# Patient Record
Sex: Female | Born: 1990 | Race: Black or African American | Hispanic: No | Marital: Single | Smoking: Never smoker
Health system: Southern US, Community
[De-identification: ages and names within clinical notes are randomized; demographics above are authoritative.]

## PROBLEM LIST (undated history)

## (undated) DIAGNOSIS — R350 Frequency of micturition: Principal | ICD-10-CM

## (undated) DIAGNOSIS — R1032 Left lower quadrant pain: Secondary | ICD-10-CM

## (undated) DIAGNOSIS — A599 Trichomoniasis, unspecified: Secondary | ICD-10-CM

## (undated) DIAGNOSIS — R8789 Other abnormal findings in specimens from female genital organs: Secondary | ICD-10-CM

## (undated) DIAGNOSIS — N926 Irregular menstruation, unspecified: Secondary | ICD-10-CM

## (undated) DIAGNOSIS — M549 Dorsalgia, unspecified: Secondary | ICD-10-CM

## (undated) DIAGNOSIS — Z309 Encounter for contraceptive management, unspecified: Secondary | ICD-10-CM

## (undated) DIAGNOSIS — B9689 Other specified bacterial agents as the cause of diseases classified elsewhere: Secondary | ICD-10-CM

## (undated) DIAGNOSIS — B379 Candidiasis, unspecified: Secondary | ICD-10-CM

## (undated) DIAGNOSIS — N760 Acute vaginitis: Secondary | ICD-10-CM

## (undated) DIAGNOSIS — A749 Chlamydial infection, unspecified: Secondary | ICD-10-CM

## (undated) HISTORY — DX: Trichomoniasis, unspecified: A59.9

## (undated) HISTORY — DX: Frequency of micturition: R35.0

## (undated) HISTORY — DX: Irregular menstruation, unspecified: N92.6

## (undated) HISTORY — DX: Other abnormal findings in specimens from female genital organs: R87.89

## (undated) HISTORY — DX: Acute vaginitis: N76.0

## (undated) HISTORY — DX: Chlamydial infection, unspecified: A74.9

## (undated) HISTORY — DX: Encounter for contraceptive management, unspecified: Z30.9

## (undated) HISTORY — DX: Other specified bacterial agents as the cause of diseases classified elsewhere: B96.89

## (undated) HISTORY — DX: Left lower quadrant pain: R10.32

## (undated) HISTORY — DX: Candidiasis, unspecified: B37.9

---
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
## 2011-06-13 IMAGING — US US BREAST*L*
1 series · 4 of 4 positions shown · non-contrast
Comparison: None.

CLINICAL DATA: The patient feels a lump in the upper portion of
the left breast.

LEFT BREAST ULTRASOUND

[Series 1: us breast*left* · 0.08mm/px · 4 of 4 slices shown]
[im 1/4]
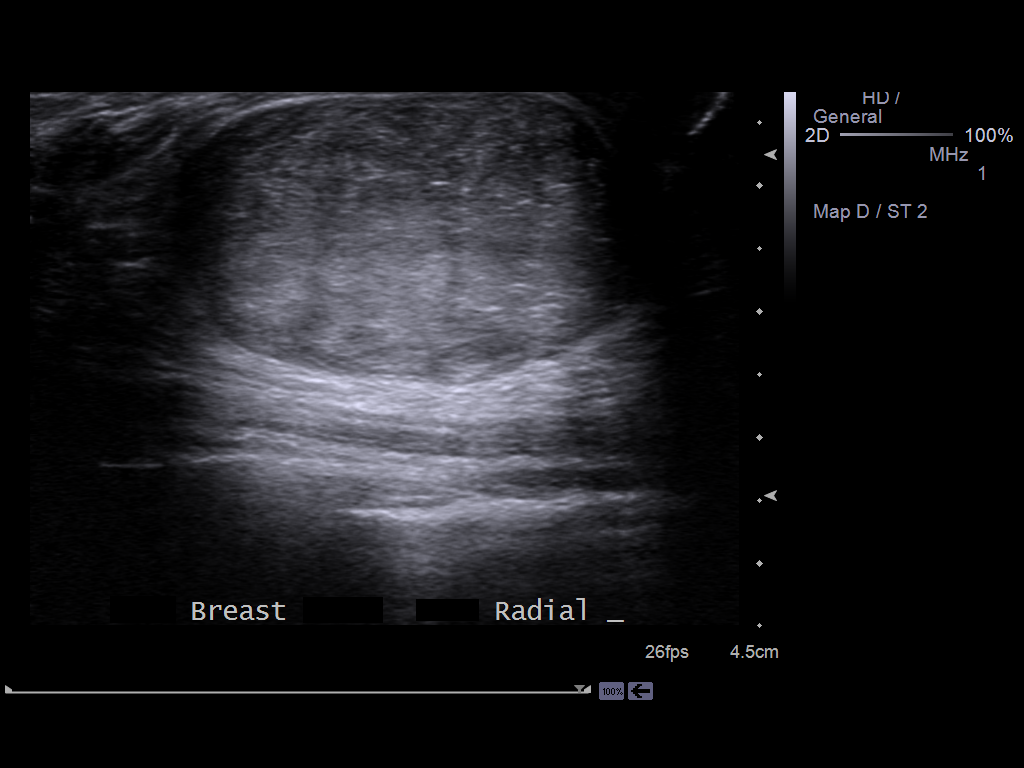
[im 2/4]
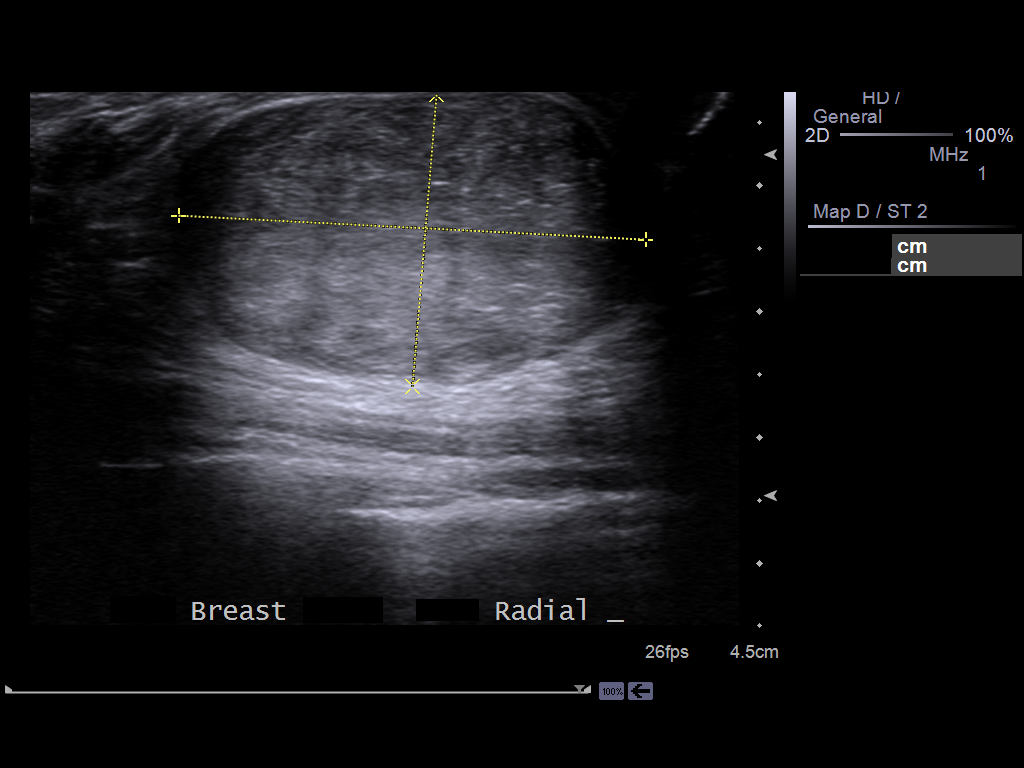
[im 3/4]
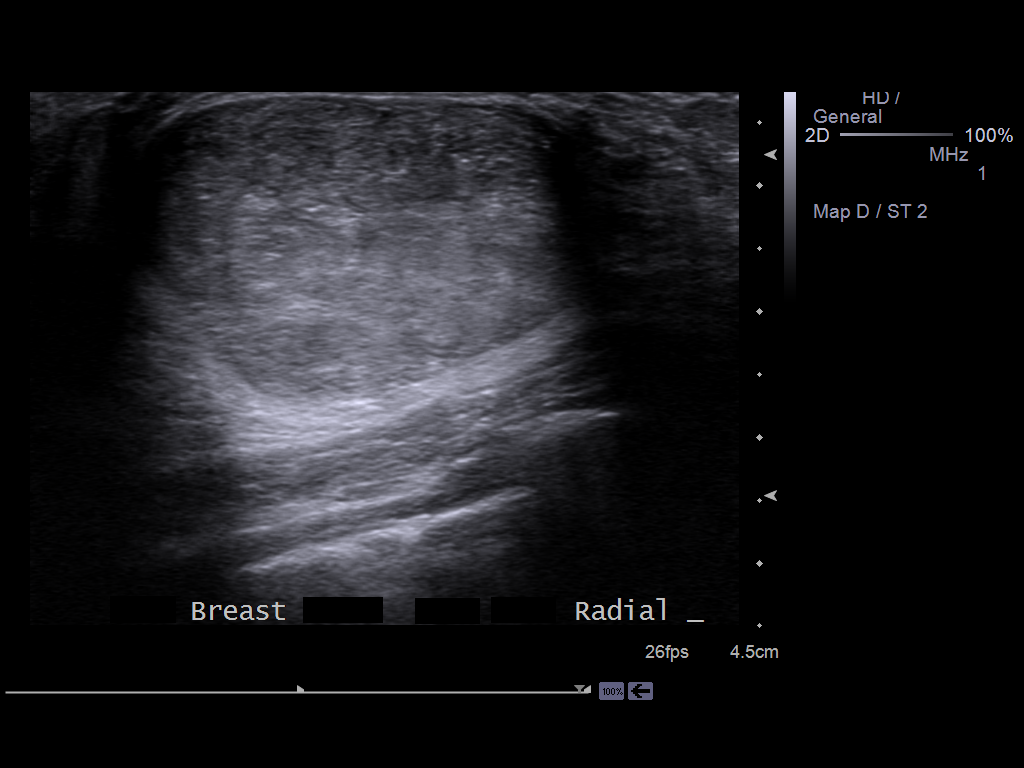
[im 4/4]
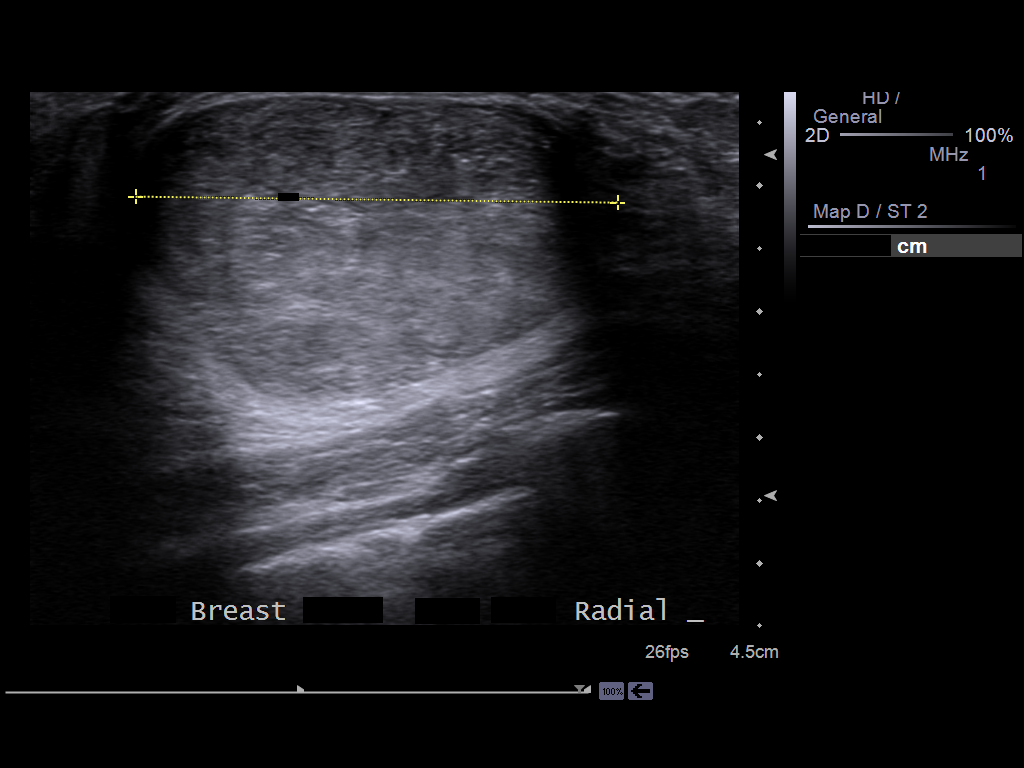

[4 of 4 positions shown; findings below may reference images not displayed]

On physical exam, I palpate a 3 cm oval mobile nodule at 12 o'clock
2 cm from the left nipple.
FINDINGS: Ultrasound is performed, showing an oval homogeneous
horizontally oriented well-defined solid mass in this location
measuring 3.7 x 2.3 x 3.8 cm.  This is very likely a benign
fibroadenoma.  I discussed management options with the patient
including short interval follow-up ultrasound (6, 12 and 24
months), ultrasound-guided core needle biopsy and surgical
excisional biopsy.  I recommended short interval follow-up
ultrasound given the high likelihood of benignancy.  The patient
states that she will consider all of these options before deciding.
IMPRESSION: Probably benign fibroadenoma in the left breast as described above.
Short interval follow-up ultrasound at 6, 12 and 24 months
suggested.  The patient will consider this as well as other options
of ultrasound-guided core needle biopsy or surgical excisional
biopsy.

BI-RADS CATEGORY 3:  Probably benign finding(s) - short interval
follow-up suggested.

## 2011-06-17 ENCOUNTER — Emergency Department (HOSPITAL_COMMUNITY)
Admission: EM | Admit: 2011-06-17 | Discharge: 2011-06-17 | Disposition: A | Discharge status: Home / Self Care | Payer: Medicaid Other | Attending: Emergency Medicine | Admitting: Emergency Medicine

## 2011-06-17 ENCOUNTER — Encounter (HOSPITAL_COMMUNITY): Payer: Self-pay | Admitting: Emergency Medicine

## 2011-06-17 DIAGNOSIS — H9209 Otalgia, unspecified ear: Secondary | ICD-10-CM | POA: Insufficient documentation

## 2011-06-17 DIAGNOSIS — H9202 Otalgia, left ear: Secondary | ICD-10-CM

## 2011-06-17 MED ORDER — OXYCODONE-ACETAMINOPHEN 5-325 MG PO TABS
1.0000 | ORAL_TABLET | Freq: Once | ORAL | Status: AC
  Administered 2011-06-17: 1 via ORAL
  Filled 2011-06-17: qty 1

## 2011-06-17 NOTE — ED Provider Notes (Signed)
History     CSN: [id number]  Arrival date & time 06/17/11  4098   First MD Initiated Contact with Patient 06/17/11 0407      Chief Complaint  Patient presents with  . Otalgia    (Consider location/radiation/quality/duration/timing/severity/associated sxs/prior treatment) Patient is a 21 y.o. female presenting with ear pain. The history is provided by the patient.  Otalgia  She was taking bone out of her hair and accidentally poked her left ear with a bobby pin. She is complaining of pain in that ear which is moderate to severe and she rates it 8/10. Nothing makes it better nothing makes it worse. She thinks her hearing is somewhat diminished in that ear but she can't hear out of it. She denies other injury.  History reviewed. No pertinent past medical history.  History reviewed. No pertinent past surgical history.  History reviewed. No pertinent family history.  History  Substance Use Topics  . Smoking status: Never Smoker   . Smokeless tobacco: Not on file  . Alcohol Use: No    OB History    Grav Para Term Preterm Abortions TAB SAB Ect Mult Living                  Review of Systems  HENT: Positive for ear pain.   All other systems reviewed and are negative.    Allergies  Review of patient's allergies indicates not on file.  Home Medications  No current outpatient prescriptions on file.  BP 112/64  Pulse 74  Temp(Src) 98.3 F (36.8 C) (Oral)  Resp 20  Ht 5\' 5"  (1.651 m)  Wt 138 lb (62.596 kg)  BMI 22.96 kg/m2  SpO2 100%  Physical Exam  Nursing note and vitals reviewed.  21 year old female who is resting comfortably and in no acute distress. Vital signs are normal. Oxygen saturation is 100% which is normal. Head is normocephalic and atraumatic. PERRLA, EOMI. Tympanic membranes show mild erythema on the left but no evidence of puncture. Light reflexes normal. There is no evidence of any trauma to the external auditory canal. Oropharynx is clear. Neck is  nontender and soft supple without adenopathy. Back is nontender. Lungs are clear without rales, wheezes, or rhonchi. Heart has regular rate and rhythm without murmur. Abdomen is soft, flat, nontender. Extremities have full range of motion without pain. Skin is warm and dry without rash. Neurologic exam is normal.  ED Course  Procedures (including critical care time)  Labs Reviewed - No data to display No results found.   No diagnosis found.    MDM  Left ear pain with out evidence of any injury to the ear. She will need to be treated symptomatically. No indication for antibiotic drops.        Dione Booze, MD 06/17/11 [phone]

## 2011-06-17 NOTE — ED Notes (Signed)
Patient states she woke up and one of her bobby pins had fallen from her hair. When she went to fix it, she accidentally pushed it into her left ear. Complaining of left ear pain. Denies bleeding or drainage from ear.

## 2011-06-17 NOTE — Discharge Instructions (Signed)
Take Tylenol or ibuprofen as needed for pain. Return to the emergency department his symptoms are getting worse.

## 2011-10-05 ENCOUNTER — Emergency Department (HOSPITAL_COMMUNITY)
Admission: EM | Admit: 2011-10-05 | Discharge: 2011-10-05 | Disposition: A | Discharge status: Home / Self Care | Payer: Medicaid Other | Attending: Emergency Medicine | Admitting: Emergency Medicine

## 2011-10-05 ENCOUNTER — Encounter (HOSPITAL_COMMUNITY): Payer: Self-pay | Admitting: *Deleted

## 2011-10-05 DIAGNOSIS — S61209A Unspecified open wound of unspecified finger without damage to nail, initial encounter: Secondary | ICD-10-CM | POA: Insufficient documentation

## 2011-10-05 DIAGNOSIS — Y998 Other external cause status: Secondary | ICD-10-CM | POA: Insufficient documentation

## 2011-10-05 DIAGNOSIS — Y9389 Activity, other specified: Secondary | ICD-10-CM | POA: Insufficient documentation

## 2011-10-05 DIAGNOSIS — S61411A Laceration without foreign body of right hand, initial encounter: Secondary | ICD-10-CM

## 2011-10-05 DIAGNOSIS — W268XXA Contact with other sharp object(s), not elsewhere classified, initial encounter: Secondary | ICD-10-CM | POA: Insufficient documentation

## 2011-10-05 MED ORDER — BACITRACIN ZINC 500 UNIT/GM EX OINT
TOPICAL_OINTMENT | CUTANEOUS | Status: AC
  Filled 2011-10-05: qty 1.8

## 2011-10-05 MED ORDER — TETANUS-DIPHTH-ACELL PERTUSSIS 5-2.5-18.5 LF-MCG/0.5 IM SUSP: 0.5000 mL | Freq: Once | INTRAMUSCULAR | Status: DC

## 2011-10-05 NOTE — ED Provider Notes (Signed)
Medical screening examination/treatment/procedure(s) were performed by non-physician practitioner and as supervising physician I was immediately available for consultation/collaboration.  Flint Melter, MD 10/05/11 [phone]

## 2011-10-05 NOTE — ED Notes (Signed)
Mult finger lac, broke glass in door when trying to get into her house, Was locked out

## 2011-10-05 NOTE — ED Provider Notes (Signed)
History     CSN: [id number]  Arrival date & time 10/05/11  2150   First MD Initiated Contact with Patient 10/05/11 2200      Chief Complaint  Patient presents with  . Laceration    (Consider location/radiation/quality/duration/timing/severity/associated sxs/prior treatment) HPI Comments: Patient complains of several small lacerations to her right hand that occurred when she was trying to open a window and the glass broke. Incident occurred approximately 2 hours ago. She complains of bleeding to her right thumb. She denies numbness or weakness of her hand or fingers. She states she received a tetanus vaccine when she gave childbirth one year ago.   Patient is a 21 y.o. female presenting with skin laceration. The history is provided by the patient.  Laceration  The incident occurred 1 to 2 hours ago. The laceration is located on the right hand. The laceration is 1 cm in size. The laceration mechanism was a broken glass. The pain is mild. The pain has been constant since onset. She reports no foreign bodies present. Her tetanus status is unknown.    History reviewed. No pertinent past medical history.  History reviewed. No pertinent past surgical history.  History reviewed. No pertinent family history.  History  Substance Use Topics  . Smoking status: Never Smoker   . Smokeless tobacco: Not on file  . Alcohol Use: No    OB History    Grav Para Term Preterm Abortions TAB SAB Ect Mult Living                  Review of Systems  Constitutional: Negative for fever and chills.  Genitourinary: Negative for dysuria and difficulty urinating.  Skin: Positive for wound. Negative for color change.       Lacerations to the fingers of the right hand  Neurological: Negative for weakness and numbness.  Hematological: Does not bruise/bleed easily.  All other systems reviewed and are negative.    Allergies  Bee venom  Home Medications   Current Outpatient Rx  Name Route Sig  Dispense Refill  . MEDROXYPROGESTERONE ACETATE 150 MG/ML IM SUSP Intramuscular Inject 150 mg into the muscle every 3 (three) months.      BP 109/62  Pulse 101  Temp 98.8 F (37.1 C) (Oral)  Resp 20  Ht 5\' 6"  (1.676 m)  Wt 134 lb (60.782 kg)  BMI 21.63 kg/m2  SpO2 100%  Physical Exam  Nursing note and vitals reviewed. Constitutional: She is oriented to person, place, and time. She appears well-developed and well-nourished. No distress.  HENT:  Head: Normocephalic and atraumatic.  Cardiovascular: Normal rate, regular rhythm and normal heart sounds.   Pulmonary/Chest: Effort normal and breath sounds normal.  Musculoskeletal: She exhibits tenderness. She exhibits no edema.       Right hand: She exhibits laceration. She exhibits normal range of motion, no tenderness, no bony tenderness, normal two-point discrimination, normal capillary refill, no deformity and no swelling. normal sensation noted. Normal strength noted.       Hands: Neurological: She is alert and oriented to person, place, and time. She exhibits normal muscle tone. Coordination normal.  Skin:       See MS exam    ED Course  Procedures (including critical care time)  Labs Reviewed - No data to display      MDM    Skin avulsion of the distal end of the right thumb. Bleeding is controlled with pressure. Sensation is intact. Patient has full range of motion of all  her fingers or several small superficial cuts to the third fourth and fifth fingers of the same hand. Radial pulse is brisk cap refill is less than 2 seconds   Wounds were cleaned with wound cleanser and Xeroform dressing was applied to the thumb, pain improved, Td was UTD. Patient advised to return to ER for any worsening symptoms.  Wound(s) explored with adequate hemostasis through ROM, no apparent gross foreign body retained, no significant involvement of deep structures such as bone / joint / tendon / or neurovascular involvement noted.  Baseline  Strength and Sensation to affected extremity(ies) with normal light touch for Pt, distal NVI with CR< 2 secs and pulse(s) intact to affected extremity(ies).   Xia Stohr L. Shalamar Crays, PA 10/05/11 2226  Duilio Heritage L. [place], [place] 10/05/11 2228

## 2011-10-05 NOTE — ED Notes (Signed)
Patient states she had a tetanus at [facility] tree during prenatal care, just to be sure she will check on Monday, right thumb dressed with xeroform, bacitracin applied to minor cuts on fingers and band aids applied

## 2011-10-14 ENCOUNTER — Emergency Department (HOSPITAL_COMMUNITY)
Admission: EM | Admit: 2011-10-14 | Discharge: 2011-10-14 | Disposition: A | Discharge status: Home / Self Care | Payer: Medicaid Other | Attending: Emergency Medicine | Admitting: Emergency Medicine

## 2011-10-14 ENCOUNTER — Encounter (HOSPITAL_COMMUNITY): Payer: Self-pay | Admitting: *Deleted

## 2011-10-14 DIAGNOSIS — K529 Noninfective gastroenteritis and colitis, unspecified: Secondary | ICD-10-CM

## 2011-10-14 DIAGNOSIS — R112 Nausea with vomiting, unspecified: Secondary | ICD-10-CM | POA: Insufficient documentation

## 2011-10-14 DIAGNOSIS — R197 Diarrhea, unspecified: Secondary | ICD-10-CM | POA: Insufficient documentation

## 2011-10-14 LAB — URINALYSIS, ROUTINE W REFLEX MICROSCOPIC
Hgb urine dipstick: NEGATIVE
Protein, ur: NEGATIVE mg/dL
Urobilinogen, UA: 0.2 mg/dL (ref 0.0–1.0)

## 2011-10-14 LAB — CBC WITH DIFFERENTIAL/PLATELET
Basophils Absolute: 0 10*3/uL (ref 0.0–0.1)
Eosinophils Absolute: 0.5 10*3/uL (ref 0.0–0.7)
Eosinophils Relative: 7 % — ABNORMAL HIGH (ref 0–5)
MCH: 30.6 pg (ref 26.0–34.0)
MCHC: 34.7 g/dL (ref 30.0–36.0)
MCV: 88.2 fL (ref 78.0–100.0)
Platelets: 201 10*3/uL (ref 150–400)
RDW: 12.1 % (ref 11.5–15.5)

## 2011-10-14 LAB — PREGNANCY, URINE: Preg Test, Ur: NEGATIVE

## 2011-10-14 LAB — BASIC METABOLIC PANEL
Calcium: 9.8 mg/dL (ref 8.4–10.5)
GFR calc non Af Amer: >90 mL/min (ref >90)
Glucose, Bld: 102 mg/dL — ABNORMAL HIGH (ref 70–99)
Sodium: 136 mEq/L (ref 135–145)

## 2011-10-14 MED ORDER — ONDANSETRON HCL 4 MG/2ML IJ SOLN: 4.0000 mg | Freq: Once | INTRAMUSCULAR | Status: DC

## 2011-10-14 MED ORDER — SODIUM CHLORIDE 0.9 % IV BOLUS (SEPSIS)
1000.0000 mL | Freq: Once | INTRAVENOUS | Status: AC
  Administered 2011-10-14: 1000 mL via INTRAVENOUS

## 2011-10-14 MED ORDER — PROMETHAZINE HCL 25 MG PO TABS: 25.0000 mg | ORAL_TABLET | Freq: Four times a day (QID) | ORAL | Status: DC | PRN

## 2011-10-14 MED ORDER — MORPHINE SULFATE 4 MG/ML IJ SOLN: 4.0000 mg | Freq: Once | INTRAMUSCULAR | Status: DC

## 2011-10-14 NOTE — ED Notes (Addendum)
Pt c/o diarrhea that started last night, n/v that started this am, sharp stabbing pain to bilateral eyes worse with n/v, c/o headache. Pt reports that her brother was sick with diarrhea yesterday as well.

## 2011-10-14 NOTE — ED Provider Notes (Signed)
History     CSN: [id number]  Arrival date & time 10/14/11  1740   First MD Initiated Contact with Patient 10/14/11 1747      Chief Complaint  Patient presents with  . Nausea  . Emesis  . Diarrhea    (Consider location/radiation/quality/duration/timing/severity/associated sxs/prior treatment) HPI Pt reports she began having watery diarrhea last night associated with nausea and vomiting. No fever, no blood. Some intermittent cramping abdominal pain. Symptoms persisted today and she woke up with a moderate aching headache behind eyes.   History reviewed. No pertinent past medical history.  History reviewed. No pertinent past surgical history.  No family history on file.  History  Substance Use Topics  . Smoking status: Never Smoker   . Smokeless tobacco: Not on file  . Alcohol Use: No    OB History    Grav Para Term Preterm Abortions TAB SAB Ect Mult Living                  Review of Systems All other systems reviewed and are negative except as noted in HPI.   Allergies  Bee venom  Home Medications   Current Outpatient Rx  Name Route Sig Dispense Refill  . MEDROXYPROGESTERONE ACETATE 150 MG/ML IM SUSP Intramuscular Inject 150 mg into the muscle every 3 (three) months.      BP 107/63  Pulse 86  Temp 98.1 F (36.7 C) (Oral)  Resp 16  Ht 5\' 5"  (1.651 m)  Wt 138 lb (62.596 kg)  BMI 22.96 kg/m2  SpO2 100%  Physical Exam  Nursing note and vitals reviewed. Constitutional: She is oriented to person, place, and time. She appears well-developed and well-nourished.  HENT:  Head: Normocephalic and atraumatic.  Eyes: EOM are normal. Pupils are equal, round, and reactive to light.  Neck: Normal range of motion. Neck supple.  Cardiovascular: Normal rate, normal heart sounds and intact distal pulses.   Pulmonary/Chest: Effort normal and breath sounds normal.  Abdominal: Bowel sounds are normal. She exhibits no distension. There is no tenderness.  Musculoskeletal:  Normal range of motion. She exhibits no edema and no tenderness.  Neurological: She is alert and oriented to person, place, and time. She has normal strength. No cranial nerve deficit or sensory deficit.  Skin: Skin is warm and dry. No rash noted.  Psychiatric: She has a normal mood and affect.    ED Course  Procedures (including critical care time)  Labs Reviewed  CBC WITH DIFFERENTIAL - Abnormal; Notable for the following:    Neutrophils Relative 36 (*)     Lymphocytes Relative 52 (*)     Eosinophils Relative 7 (*)     All other components within normal limits  BASIC METABOLIC PANEL - Abnormal; Notable for the following:    Glucose, Bld 102 (*)     All other components within normal limits  URINALYSIS, ROUTINE W REFLEX MICROSCOPIC  PREGNANCY, URINE   No results found.   No diagnosis found.    MDM  Labs unremarkable. Pt feeling better. Will attempt PO trial. Waiting for IVF bolus to finish.   Pt tolerating PO trial. Ready for discharge.      Charles B. Bernette Mayers, MD 10/14/11 1919

## 2011-10-14 NOTE — ED Notes (Signed)
Patient does not wish to take anything for pain or nausea at this time.

## 2011-10-21 ENCOUNTER — Encounter (HOSPITAL_COMMUNITY): Payer: Self-pay | Admitting: Emergency Medicine

## 2011-10-21 ENCOUNTER — Emergency Department (HOSPITAL_COMMUNITY)
Admission: EM | Admit: 2011-10-21 | Discharge: 2011-10-21 | Disposition: A | Discharge status: Home / Self Care | Payer: Medicaid Other | Attending: Emergency Medicine | Admitting: Emergency Medicine

## 2011-10-21 DIAGNOSIS — X58XXXA Exposure to other specified factors, initial encounter: Secondary | ICD-10-CM | POA: Insufficient documentation

## 2011-10-21 DIAGNOSIS — S335XXA Sprain of ligaments of lumbar spine, initial encounter: Secondary | ICD-10-CM | POA: Insufficient documentation

## 2011-10-21 DIAGNOSIS — S39012A Strain of muscle, fascia and tendon of lower back, initial encounter: Secondary | ICD-10-CM

## 2011-10-21 HISTORY — DX: Dorsalgia, unspecified: M54.9

## 2011-10-21 MED ORDER — CYCLOBENZAPRINE HCL 10 MG PO TABS: 10.0000 mg | ORAL_TABLET | Freq: Three times a day (TID) | ORAL | Status: AC | PRN

## 2011-10-21 MED ORDER — NAPROXEN 500 MG PO TABS: 500.0000 mg | ORAL_TABLET | Freq: Two times a day (BID) | ORAL | Status: DC

## 2011-10-21 NOTE — ED Provider Notes (Signed)
Medical screening examination/treatment/procedure(s) were performed by non-physician practitioner and as supervising physician I was immediately available for consultation/collaboration.   Jairen Goldfarb M Shemeca Lukasik, MD 10/21/11 2326 

## 2011-10-21 NOTE — ED Notes (Signed)
Lower back pain that has been going on for a while, states that her job made her come and be checked out.

## 2011-10-21 NOTE — ED Provider Notes (Signed)
History     CSN: [id number]  Arrival date & time 10/21/11  2209   First MD Initiated Contact with Patient 10/21/11 2222      Chief Complaint  Patient presents with  . Back Pain    (Consider location/radiation/quality/duration/timing/severity/associated sxs/prior treatment) HPI Comments: Patient c/o low back pain that began this evening while at work.  States she recently started a new job at [employer] that requires her to do a lot of bending and heavy lifting.  States that her back has been hurting more since starting this job.  She states that she injured her lower back "years ago" and has intermittent back pain since that time and her pain this evening is similar to previous.  She denies fever, urinary symptoms, incontinence, abdominal pain, numbness or weakness of her legs.  Patient is a 21 y.o. female presenting with back pain. The history is provided by the patient.  Back Pain  This is a recurrent problem. The current episode started 1 to 2 hours ago. The problem occurs constantly. The problem has not changed since onset.The pain is associated with lifting heavy objects and twisting (bending). The pain is present in the lumbar spine. The quality of the pain is described as aching. The pain does not radiate. The pain is mild. The symptoms are aggravated by bending, twisting and certain positions. Pertinent negatives include no fever, no numbness, no abdominal pain, no abdominal swelling, no bowel incontinence, no perianal numbness, no bladder incontinence, no dysuria, no pelvic pain, no leg pain, no paresthesias, no paresis, no tingling and no weakness. She has tried nothing for the symptoms.    Past Medical History  Diagnosis Date  . Back pain     History reviewed. No pertinent past surgical history.  History reviewed. No pertinent family history.  History  Substance Use Topics  . Smoking status: Never Smoker   . Smokeless tobacco: Not on file  . Alcohol Use: No    OB  History    Grav Para Term Preterm Abortions TAB SAB Ect Mult Living                  Review of Systems  Constitutional: Negative for fever.  Respiratory: Negative for shortness of breath.   Gastrointestinal: Negative for vomiting, abdominal pain, constipation and bowel incontinence.  Genitourinary: Negative for bladder incontinence, dysuria, hematuria, flank pain, decreased urine volume, vaginal bleeding, vaginal discharge, difficulty urinating and pelvic pain.       Perineal numbness or incontinence of urine or feces  Musculoskeletal: Positive for back pain. Negative for joint swelling.  Skin: Negative for rash.  Neurological: Negative for tingling, weakness, numbness and paresthesias.  All other systems reviewed and are negative.    Allergies  Bee venom  Home Medications   Current Outpatient Rx  Name Route Sig Dispense Refill  . CYCLOBENZAPRINE HCL 10 MG PO TABS Oral Take 1 tablet (10 mg total) by mouth 3 (three) times daily as needed for muscle spasms. 21 tablet 0  . MEDROXYPROGESTERONE ACETATE 150 MG/ML IM SUSP Intramuscular Inject 150 mg into the muscle every 3 (three) months.    Marland Kitchen NAPROXEN 500 MG PO TABS Oral Take 1 tablet (500 mg total) by mouth 2 (two) times daily. 30 tablet 0  . PROMETHAZINE HCL 25 MG PO TABS Oral Take 1 tablet (25 mg total) by mouth every 6 (six) hours as needed for nausea. 20 tablet 0    BP 117/70  Pulse 101  Temp 99.2 F (37.3  C) (Oral)  Resp 14  Ht 5\' 5"  (1.651 m)  Wt 138 lb (62.596 kg)  BMI 22.96 kg/m2  SpO2 100%  Physical Exam  Nursing note and vitals reviewed. Constitutional: She is oriented to person, place, and time. She appears well-developed and well-nourished. No distress.  HENT:  Head: Normocephalic and atraumatic.  Neck: Normal range of motion. Neck supple.  Cardiovascular: Normal rate, regular rhythm and intact distal pulses.   No murmur heard. Pulmonary/Chest: Effort normal and breath sounds normal.  Abdominal: Soft. She  exhibits no distension. There is no tenderness.  Musculoskeletal: She exhibits tenderness. She exhibits no edema.       Lumbar back: She exhibits tenderness and pain. She exhibits normal range of motion, no swelling, no deformity, no laceration and normal pulse.       Back:  Neurological: She is alert and oriented to person, place, and time. No cranial nerve deficit or sensory deficit. She exhibits normal muscle tone. Coordination and gait normal.  Reflex Scores:      Patellar reflexes are 2+ on the right side and 2+ on the left side.      Achilles reflexes are 2+ on the right side and 2+ on the left side. Skin: Skin is warm and dry.    ED Course  Procedures (including critical care time)  Labs Reviewed - No data to display   1. Lumbar strain       MDM    Previous ED charts reviewed by me.    Patient has ttp of the lumbar paraspinal muscles.  No focal neuro deficits on exam.  Ambulates with a steady gait.   Requests referral for PMD.  Likely muscular strain.  Doubt infectious or neurological process.    The patient appears reasonably screened and/or stabilized for discharge and I doubt any other medical condition or other [facility] requiring further screening, evaluation, or treatment in the ED at this time prior to discharge.    Prescribed:  Naprosyn flexeril   Yumna Ebers L. [place], [place] 10/21/11 2312

## 2011-10-21 NOTE — ED Notes (Signed)
Patient states she was at work and started having lower back pain after bending over a lot. States "I hurt my back when I was younger and I have pain off and on over the years."

## 2011-11-09 ENCOUNTER — Encounter (HOSPITAL_COMMUNITY): Payer: Self-pay | Admitting: Vascular Surgery

## 2011-11-09 ENCOUNTER — Emergency Department (HOSPITAL_COMMUNITY)
Admission: EM | Admit: 2011-11-09 | Discharge: 2011-11-09 | Disposition: A | Discharge status: Home / Self Care | Payer: Medicaid Other | Attending: Emergency Medicine | Admitting: Emergency Medicine

## 2011-11-09 DIAGNOSIS — I10 Essential (primary) hypertension: Secondary | ICD-10-CM | POA: Insufficient documentation

## 2011-11-09 DIAGNOSIS — M545 Low back pain, unspecified: Secondary | ICD-10-CM | POA: Insufficient documentation

## 2011-11-09 DIAGNOSIS — Z91038 Other insect allergy status: Secondary | ICD-10-CM | POA: Insufficient documentation

## 2011-11-09 MED ORDER — IBUPROFEN 800 MG PO TABS
800.0000 mg | ORAL_TABLET | Freq: Once | ORAL | Status: AC
  Administered 2011-11-09: 800 mg via ORAL
  Filled 2011-11-09: qty 1
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
## 2011-11-18 ENCOUNTER — Encounter (HOSPITAL_COMMUNITY): Payer: Self-pay | Admitting: Emergency Medicine

## 2011-11-18 ENCOUNTER — Emergency Department (HOSPITAL_COMMUNITY)
Admission: EM | Admit: 2011-11-18 | Discharge: 2011-11-18 | Disposition: A | Discharge status: Home / Self Care | Payer: Medicaid Other | Attending: Emergency Medicine | Admitting: Emergency Medicine

## 2011-11-18 DIAGNOSIS — Z91038 Other insect allergy status: Secondary | ICD-10-CM | POA: Insufficient documentation

## 2011-11-18 DIAGNOSIS — L42 Pityriasis rosea: Secondary | ICD-10-CM

## 2011-11-18 MED ORDER — PREDNISONE 10 MG PO TABS: ORAL_TABLET | ORAL | Status: DC

## 2011-11-18 MED ORDER — TRIAMCINOLONE ACETONIDE 0.1 % EX CREA: TOPICAL_CREAM | Freq: Two times a day (BID) | CUTANEOUS | Status: DC

## 2011-11-18 NOTE — ED Notes (Signed)
Pt c/o ringworm rash x 1 week

## 2011-11-18 NOTE — ED Provider Notes (Signed)
History     CSN: [id number]  Arrival date & time 11/18/11  1031   None     Chief Complaint  Patient presents with  . Recurrent Skin Infections    (Consider location/radiation/quality/duration/timing/severity/associated sxs/prior treatment) HPI Comments: Patient reports at least a one-week history of a rash that she thinks may be ringworm. She states that she noted a patch that was about the size of a quarter on the side of the left chest. She then noticed rash on the back, lower abdomen, and arms. She complains of itching. Is no drainage appreciated. The patient states that she has not been bothered by nausea, vomiting, diarrhea. She has had some mild temperature elevations from time to time. She presents now for assistance with this particular problem.  The history is provided by the patient.    Past Medical History  Diagnosis Date  . Back pain     History reviewed. No pertinent past surgical history.  Family History  Problem Relation Age of Onset  . Hypertension Mother     History  Substance Use Topics  . Smoking status: Never Smoker   . Smokeless tobacco: Not on file  . Alcohol Use: No    OB History    Grav Para Term Preterm Abortions TAB SAB Ect Mult Living   1 1  1      1       Review of Systems  Constitutional: Negative for activity change.       All ROS Neg except as noted in HPI  HENT: Negative for nosebleeds and neck pain.   Eyes: Negative for photophobia and discharge.  Respiratory: Negative for cough, shortness of breath and wheezing.   Cardiovascular: Negative for chest pain and palpitations.  Gastrointestinal: Negative for abdominal pain and blood in stool.  Genitourinary: Negative for dysuria, frequency and hematuria.  Musculoskeletal: Positive for back pain. Negative for arthralgias.  Skin: Positive for rash.  Neurological: Negative for dizziness, seizures and speech difficulty.  Psychiatric/Behavioral: Negative for hallucinations and confusion.      Allergies  Bee venom  Home Medications   Current Outpatient Rx  Name Route Sig Dispense Refill  . MEDROXYPROGESTERONE ACETATE 150 MG/ML IM SUSP Intramuscular Inject 150 mg into the muscle every 3 (three) months.    Marland Kitchen NAPROXEN 500 MG PO TABS Oral Take 1 tablet (500 mg total) by mouth 2 (two) times daily. 30 tablet 0  . PROMETHAZINE HCL 25 MG PO TABS Oral Take 1 tablet (25 mg total) by mouth every 6 (six) hours as needed for nausea. 20 tablet 0    BP 120/67  Pulse 83  Temp 99.9 F (37.7 C)  Resp 16  Ht 5\' 5"  (1.651 m)  Wt 129 lb (58.514 kg)  BMI 21.47 kg/m2  SpO2 100%  Physical Exam  Nursing note and vitals reviewed. Constitutional: She is oriented to person, place, and time. She appears well-developed and well-nourished.  Non-toxic appearance.  HENT:  Head: Normocephalic.  Right Ear: Tympanic membrane and external ear normal.  Left Ear: Tympanic membrane and external ear normal.  Eyes: EOM and lids are normal. Pupils are equal, round, and reactive to light.  Neck: Normal range of motion. Neck supple. Carotid bruit is not present.  Cardiovascular: Normal rate, regular rhythm, normal heart sounds, intact distal pulses and normal pulses.   Pulmonary/Chest: Breath sounds normal. No respiratory distress.  Abdominal: Soft. Bowel sounds are normal. There is no tenderness. There is no guarding.  Musculoskeletal: Normal range of motion.  Lymphadenopathy:       Head (right side): No submandibular adenopathy present.       Head (left side): No submandibular adenopathy present.    She has no cervical adenopathy.  Neurological: She is alert and oriented to person, place, and time. She has normal strength. No cranial nerve deficit or sensory deficit.  Skin: Skin is warm and dry.       There is a 2 cm pink tube age skin lesion of the left upper chest just behind the tail of the breast. There is a rib slightly raised border present. There are oval lesions in patches on the upper back  mid back right upper arm left lower arm and lower abdomen.  Psychiatric: She has a normal mood and affect. Her speech is normal.    ED Course  Procedures (including critical care time)  Labs Reviewed - No data to display No results found.   No diagnosis found.    MDM  I have reviewed nursing notes, vital signs, and all appropriate lab and imaging results for this patient. Suspect patient has pityriasis rosea. Patient is treated with prednisone taper and triamcinolone drained to assist with the itching. Patient has been educated on this particular rash and understands it may take from 2-8 weeks to clear. Patient has been given the name of the dermatologist for additional evaluation if not improving.       Kathie Dike, [place] 11/18/11 1247

## 2011-11-19 NOTE — ED Provider Notes (Signed)
Medical screening examination/treatment/procedure(s) were performed by non-physician practitioner and as supervising physician I was immediately available for consultation/collaboration.  Shelda Jakes, MD 11/19/11 [phone]

## 2011-12-22 ENCOUNTER — Encounter (HOSPITAL_COMMUNITY): Payer: Self-pay | Admitting: *Deleted

## 2011-12-22 ENCOUNTER — Emergency Department (HOSPITAL_COMMUNITY)
Admission: EM | Admit: 2011-12-22 | Discharge: 2011-12-22 | Disposition: A | Discharge status: Home / Self Care | Payer: Medicaid Other | Attending: Emergency Medicine | Admitting: Emergency Medicine

## 2011-12-22 DIAGNOSIS — R21 Rash and other nonspecific skin eruption: Secondary | ICD-10-CM | POA: Insufficient documentation

## 2011-12-22 DIAGNOSIS — Z91038 Other insect allergy status: Secondary | ICD-10-CM | POA: Insufficient documentation

## 2011-12-22 DIAGNOSIS — Z8249 Family history of ischemic heart disease and other diseases of the circulatory system: Secondary | ICD-10-CM | POA: Insufficient documentation

## 2011-12-22 MED ORDER — PREDNISOLONE SODIUM PHOSPHATE 15 MG/5ML PO SOLN: 60.0000 mg | Freq: Every day | ORAL | Status: AC

## 2011-12-22 MED ORDER — FAMOTIDINE 20 MG PO TABS
20.0000 mg | ORAL_TABLET | Freq: Once | ORAL | Status: AC
  Administered 2011-12-22: 20 mg via ORAL
  Filled 2011-12-22: qty 1

## 2011-12-22 MED ORDER — DIPHENHYDRAMINE HCL 25 MG PO CAPS
50.0000 mg | ORAL_CAPSULE | Freq: Once | ORAL | Status: AC
  Administered 2011-12-22: 50 mg via ORAL
  Filled 2011-12-22: qty 2

## 2011-12-22 MED ORDER — PREDNISOLONE SODIUM PHOSPHATE 15 MG/5ML PO SOLN
60.0000 mg | Freq: Once | ORAL | Status: AC
  Administered 2011-12-22: 60 mg via ORAL
  Filled 2011-12-22: qty 20

## 2011-12-22 NOTE — ED Notes (Signed)
Pt presents with a small  Raised red rash on rt side neck and upper chest. Pt states was here on 1 st of October and treated for same type rash. Pt states the prednisone was "too strong for her and would throw them up". Pt also has a scaly rash on left side, and top of buttocks. NAD noted. Denies SOB. Pt is alert and oriented x 4.

## 2011-12-22 NOTE — ED Provider Notes (Signed)
History     CSN: [id number]  Arrival date & time 12/22/11  4098   First MD Initiated Contact with Patient 12/22/11 1938      Chief Complaint  Patient presents with  . Rash    (Consider location/radiation/quality/duration/timing/severity/associated sxs/prior treatment) HPI Comments: Pt seen here on 12-18-11 and dx with pityriasis and prescribed prednisone taper.  States she doesn't take pills well any way but the medicine tasted bad and made her throw up.  No new sxs.  No fever or chills.  Patient is a 21 y.o. female presenting with rash. The history is provided by the patient. No language interpreter was used.  Rash  This is a new problem. Episode onset: ~ 1 week ago. The problem has not changed since onset.The problem is associated with nothing. There has been no fever. The patient is experiencing no pain. The pain has been constant since onset. Associated symptoms include itching.    Past Medical History  Diagnosis Date  . Back pain     History reviewed. No pertinent past surgical history.  Family History  Problem Relation Age of Onset  . Hypertension Mother     History  Substance Use Topics  . Smoking status: Never Smoker   . Smokeless tobacco: Not on file  . Alcohol Use: No    OB History    Grav Para Term Preterm Abortions TAB SAB Ect Mult Living   1 1  1      1       Review of Systems  Constitutional: Negative for fever and chills.  Musculoskeletal: Negative for myalgias.  Skin: Positive for itching and rash.  All other systems reviewed and are negative.    Allergies  Bee venom  Home Medications   Current Outpatient Rx  Name Route Sig Dispense Refill  . MEDROXYPROGESTERONE ACETATE 150 MG/ML IM SUSP Intramuscular Inject 150 mg into the muscle every 3 (three) months.    Marland Kitchen PREDNISOLONE SODIUM PHOSPHATE 15 MG/5ML PO SOLN Oral Take 20 mLs (60 mg total) by mouth daily. 100 mL 0    BP 127/79  Pulse 75  Temp 99.2 F (37.3 C) (Oral)  Resp 20  Ht 5\' 5"   (1.651 m)  Wt 138 lb (62.596 kg)  BMI 22.96 kg/m2  SpO2 100%  Physical Exam  Nursing note and vitals reviewed. Constitutional: She is oriented to person, place, and time. She appears well-developed and well-nourished. No distress.  HENT:  Head: Normocephalic and atraumatic.  Eyes: EOM are normal.  Neck: Normal range of motion.  Cardiovascular: Normal rate, regular rhythm and normal heart sounds.   Pulmonary/Chest: Effort normal and breath sounds normal.  Abdominal: Soft. She exhibits no distension. There is no tenderness.  Musculoskeletal: Normal range of motion.  Neurological: She is alert and oriented to person, place, and time.  Skin: Skin is warm and dry. Rash noted. No purpura noted. Rash is maculopapular. Rash is not nodular, not pustular, not vesicular and not urticarial.     Psychiatric: She has a normal mood and affect. Judgment normal.    ED Course  Procedures (including critical care time)  Labs Reviewed - No data to display No results found.   1. Rash       MDM  rx-orapred 20 ml QD, 100 ml Benadryl and pepcid F/y with dermatology referral from last visit.      Pt   Evalina Field, [place] 12/22/11 2006

## 2011-12-22 NOTE — ED Notes (Addendum)
Pt has rash x 1  Month. Pt was given prednisone and was unable to swallow pills.

## 2011-12-23 NOTE — ED Provider Notes (Signed)
Medical screening examination/treatment/procedure(s) were performed by non-physician practitioner and as supervising physician I was immediately available for consultation/collaboration.   Jamarius Saha L Shaianne Nucci, MD 12/23/11 0749 

## 2012-01-18 ENCOUNTER — Emergency Department (HOSPITAL_COMMUNITY)
Admission: EM | Admit: 2012-01-18 | Discharge: 2012-01-18 | Disposition: A | Discharge status: Home / Self Care | Payer: Medicaid Other | Attending: Emergency Medicine | Admitting: Emergency Medicine

## 2012-01-18 ENCOUNTER — Encounter (HOSPITAL_COMMUNITY): Payer: Self-pay

## 2012-01-18 DIAGNOSIS — R05 Cough: Secondary | ICD-10-CM

## 2012-01-18 DIAGNOSIS — Z79899 Other long term (current) drug therapy: Secondary | ICD-10-CM | POA: Insufficient documentation

## 2012-01-18 DIAGNOSIS — Z91038 Other insect allergy status: Secondary | ICD-10-CM | POA: Insufficient documentation

## 2012-01-18 DIAGNOSIS — H109 Unspecified conjunctivitis: Secondary | ICD-10-CM | POA: Insufficient documentation

## 2012-01-18 DIAGNOSIS — H5789 Other specified disorders of eye and adnexa: Secondary | ICD-10-CM | POA: Insufficient documentation

## 2012-01-18 DIAGNOSIS — J029 Acute pharyngitis, unspecified: Secondary | ICD-10-CM | POA: Insufficient documentation

## 2012-01-18 DIAGNOSIS — H53149 Visual discomfort, unspecified: Secondary | ICD-10-CM | POA: Insufficient documentation

## 2012-01-18 MED ORDER — TOBRAMYCIN 0.3 % OP SOLN
1.0000 [drp] | Freq: Once | OPHTHALMIC | Status: AC
  Administered 2012-01-18: 1 [drp] via OPHTHALMIC
  Filled 2012-01-18: qty 5

## 2012-01-18 MED ORDER — AZITHROMYCIN 250 MG PO TABS: ORAL_TABLET | ORAL | Status: DC

## 2012-01-18 MED ORDER — GUAIFENESIN-CODEINE 100-10 MG/5ML PO SYRP: 10.0000 mL | ORAL_SOLUTION | Freq: Three times a day (TID) | ORAL | Status: AC | PRN

## 2012-01-18 NOTE — ED Notes (Signed)
Cough, sore throat for 3 days.  Rt eye lids are puffy. Pt says she had d/c from rt eye. And had a stye last week.  Headache.  Alert, NAD.

## 2012-01-18 NOTE — ED Notes (Signed)
Pt c/o cough, congestion, and sore throat for 3 days with watery eyes.

## 2012-01-19 NOTE — ED Provider Notes (Signed)
History     CSN: [id number]  Arrival date & time 01/18/12  1156   First MD Initiated Contact with Patient 01/18/12 1239      Chief Complaint  Patient presents with  . Cough  . Sore Throat    (Consider location/radiation/quality/duration/timing/severity/associated sxs/prior treatment) Patient is a 21 y.o. female presenting with cough and pharyngitis. The history is provided by the patient.  Cough This is a new problem. The current episode started more than 2 days ago. The problem occurs every few minutes. The cough is productive of sputum. There has been no fever. Associated symptoms include sore throat and eye redness. Pertinent negatives include no chest pain, no chills, no ear congestion, no ear pain, no headaches, no myalgias, no shortness of breath and no wheezing. Associated symptoms comments: Eye pain and green drainage. She has tried nothing for the symptoms. The treatment provided no relief. She is not a smoker. Her past medical history does not include pneumonia or asthma.  Sore Throat This is a new problem. The current episode started in the past 7 days. The problem occurs constantly. The problem has been unchanged. Associated symptoms include coughing and a sore throat. Pertinent negatives include no abdominal pain, chest pain, chills, fever, headaches, myalgias, nausea, neck pain, numbness, rash, swollen glands, visual change, vomiting or weakness. The symptoms are aggravated by swallowing. She has tried nothing for the symptoms. The treatment provided no relief.    Past Medical History  Diagnosis Date  . Back pain     History reviewed. No pertinent past surgical history.  Family History  Problem Relation Age of Onset  . Hypertension Mother     History  Substance Use Topics  . Smoking status: Never Smoker   . Smokeless tobacco: Not on file  . Alcohol Use: No    OB History    Grav Para Term Preterm Abortions TAB SAB Ect Mult Living   1 1  1      1        Review of Systems  Constitutional: Negative for fever, chills, activity change and appetite change.  HENT: Positive for sore throat. Negative for ear pain, facial swelling, mouth sores, trouble swallowing, neck pain and neck stiffness.   Eyes: Positive for photophobia, pain, discharge, redness and itching. Negative for visual disturbance.  Respiratory: Positive for cough. Negative for chest tightness, shortness of breath, wheezing and stridor.   Cardiovascular: Negative for chest pain.  Gastrointestinal: Negative for nausea, vomiting and abdominal pain.  Musculoskeletal: Negative for myalgias.  Skin: Negative.  Negative for rash.  Neurological: Negative for dizziness, weakness, numbness and headaches.  Hematological: Negative for adenopathy.  Psychiatric/Behavioral: Negative for confusion.  All other systems reviewed and are negative.    Allergies  Bee venom  Home Medications   Current Outpatient Rx  Name Route Sig Dispense Refill  . MEDROXYPROGESTERONE ACETATE 150 MG/ML IM SUSP Intramuscular Inject 150 mg into the muscle every 3 (three) months.    . AZITHROMYCIN 250 MG PO TABS  Take two tablets on day one, then one tab qd days 2-5 6 tablet 0  . GUAIFENESIN-CODEINE 100-10 MG/5ML PO SYRP Oral Take 10 mLs by mouth 3 (three) times daily as needed for cough. 120 mL 0    BP 116/66  Pulse 80  Temp 99 F (37.2 C)  Resp 20  Ht 5\' 5"  (1.651 m)  Wt 135 lb (61.236 kg)  BMI 22.47 kg/m2  SpO2 99%  Physical Exam  Nursing note and vitals reviewed.  Constitutional: She is oriented to person, place, and time. She appears well-developed and well-nourished. No distress.  HENT:  Head: Normocephalic and atraumatic. No trismus in the jaw.  Right Ear: Tympanic membrane and ear canal normal.  Left Ear: Tympanic membrane and ear canal normal.  Mouth/Throat: Uvula is midline and mucous membranes are normal. No uvula swelling. Posterior oropharyngeal edema and posterior oropharyngeal erythema  present. No tonsillar abscesses.  Eyes: EOM are normal. Pupils are equal, round, and reactive to light. Right eye exhibits discharge and exudate. Right eye exhibits no chemosis and no hordeolum. No foreign body present in the right eye. Left eye exhibits discharge and exudate. Left eye exhibits no chemosis and no hordeolum. No foreign body present in the left eye. Right conjunctiva is injected. No scleral icterus. Right eye exhibits normal extraocular motion and no nystagmus. Left eye exhibits normal extraocular motion and no nystagmus.  Neck: Normal range of motion. Neck supple.  Cardiovascular: Normal rate, regular rhythm and normal heart sounds.   Pulmonary/Chest: Effort normal and breath sounds normal. No respiratory distress. She has no wheezes. She has no rales.  Abdominal: Soft. She exhibits no distension. There is no tenderness.  Musculoskeletal: Normal range of motion.  Lymphadenopathy:       Head (right side): No preauricular adenopathy present.       Head (left side): No preauricular adenopathy present.    She has no cervical adenopathy.  Neurological: She is alert and oriented to person, place, and time. She exhibits normal muscle tone. Coordination normal.  Skin: Skin is warm and dry.    ED Course  Procedures (including critical care time)  Labs Reviewed - No data to display No results found.   1. Conjunctivitis   2. Cough       MDM     Lung sounds are coarse bilaterally.  No rales or wheezes.  Likely bronchitis, conjunctivitis. Pt is non-toxic appearing.  Will treat with tobramycin and prescribe z-pack and robitussin AC  Pt agrees to f/u with her PMD or return here if sx's worsen     Telvin Reinders L. [place], [place] 01/19/12 2118

## 2012-01-26 NOTE — ED Provider Notes (Signed)
Medical screening examination/treatment/procedure(s) were performed by non-physician practitioner and as supervising physician I was immediately available for consultation/collaboration. Devoria Albe, MD, [facility]   Ward Givens, MD 01/26/12 [phone]

## 2012-03-28 ENCOUNTER — Emergency Department (HOSPITAL_COMMUNITY): Payer: Medicaid Other

## 2012-03-28 ENCOUNTER — Encounter (HOSPITAL_COMMUNITY): Payer: Self-pay | Admitting: Emergency Medicine

## 2012-03-28 ENCOUNTER — Emergency Department (HOSPITAL_COMMUNITY)
Admission: EM | Admit: 2012-03-28 | Discharge: 2012-03-28 | Disposition: A | Discharge status: Home / Self Care | Payer: Medicaid Other | Attending: Emergency Medicine | Admitting: Emergency Medicine

## 2012-03-28 DIAGNOSIS — M549 Dorsalgia, unspecified: Secondary | ICD-10-CM | POA: Insufficient documentation

## 2012-03-28 DIAGNOSIS — Z79899 Other long term (current) drug therapy: Secondary | ICD-10-CM | POA: Insufficient documentation

## 2012-03-28 MED ORDER — NAPROXEN 500 MG PO TABS: 500.0000 mg | ORAL_TABLET | Freq: Two times a day (BID) | ORAL | Status: DC

## 2012-03-28 MED ORDER — CYCLOBENZAPRINE HCL 10 MG PO TABS: 10.0000 mg | ORAL_TABLET | Freq: Three times a day (TID) | ORAL | Status: DC | PRN

## 2012-03-28 NOTE — ED Notes (Signed)
Pt c/o intermittent upper back pain x2-3 months. Pt states pain was at it's worse today. Pain is worse with movement.

## 2012-03-28 NOTE — ED Provider Notes (Signed)
History     CSN: [id number]  Arrival date & time 03/28/12  1801   First MD Initiated Contact with Patient 03/28/12 1827      Chief Complaint  Patient presents with  . Back Pain    (Consider location/radiation/quality/duration/timing/severity/associated sxs/prior treatment) HPI Comments: Patient c/o intermittent sharp pains between her shoulder blades.  States sx's have been persisting for 2-3 months.  Pain is precipatated by rasing her arms, lifting, and deep breathing.  States that pain is sharp and lasts just a few seconds.  She reports lifting her daughter frequently, but denies known injury.  She also denies shortness of breath, chest pain, abdominal pain, fever, numbness or weakness.  No hx of DVT, PE, pregnancy, sedentary lifestyle or recent surgery.  Patient is not a smoker, but does receive monthly Depo injections  Patient is a 22 y.o. female presenting with back pain. The history is provided by the patient.  Back Pain  This is a chronic problem. The current episode started more than 1 week ago. Episode frequency: waxing and waning. The problem has not changed since onset.The pain is associated with lifting heavy objects. The pain is present in the thoracic spine. Quality: sharp. The pain does not radiate. The pain is moderate. The symptoms are aggravated by bending, twisting and certain positions. Pertinent negatives include no chest pain, no fever, no numbness, no abdominal pain, no abdominal swelling, no bowel incontinence, no perianal numbness, no bladder incontinence, no dysuria, no pelvic pain, no leg pain, no paresthesias, no paresis, no tingling and no weakness. She has tried nothing for the symptoms. The treatment provided no relief.    Past Medical History  Diagnosis Date  . Back pain     History reviewed. No pertinent past surgical history.  Family History  Problem Relation Age of Onset  . Hypertension Mother     History  Substance Use Topics  . Smoking status:  Never Smoker   . Smokeless tobacco: Not on file  . Alcohol Use: No    OB History    Grav Para Term Preterm Abortions TAB SAB Ect Mult Living   1 1  1      1       Review of Systems  Constitutional: Negative for fever.  Respiratory: Negative for shortness of breath.   Cardiovascular: Negative for chest pain.  Gastrointestinal: Negative for vomiting, abdominal pain, constipation and bowel incontinence.  Genitourinary: Negative for bladder incontinence, dysuria, hematuria, flank pain, decreased urine volume, difficulty urinating and pelvic pain.       No perineal numbness or incontinence of urine or feces  Musculoskeletal: Positive for back pain. Negative for joint swelling.  Skin: Negative for rash.  Neurological: Negative for tingling, weakness, numbness and paresthesias.  All other systems reviewed and are negative.    Allergies  Bee venom  Home Medications   Current Outpatient Rx  Name  Route  Sig  Dispense  Refill  . CYCLOBENZAPRINE HCL 10 MG PO TABS   Oral   Take 1 tablet (10 mg total) by mouth 3 (three) times daily as needed for muscle spasms.   21 tablet   0   . MEDROXYPROGESTERONE ACETATE 150 MG/ML IM SUSP   Intramuscular   Inject 150 mg into the muscle every 3 (three) months.         Marland Kitchen NAPROXEN 500 MG PO TABS   Oral   Take 1 tablet (500 mg total) by mouth 2 (two) times daily with a meal.  20 tablet   0     BP 109/70  Pulse 80  Temp 99.4 F (37.4 C) (Oral)  Resp 18  SpO2 100%  Physical Exam  Nursing note and vitals reviewed. Constitutional: She is oriented to person, place, and time. She appears well-developed and well-nourished. No distress.  HENT:  Head: Normocephalic and atraumatic.  Neck: Normal range of motion. Neck supple.  Cardiovascular: Normal rate, regular rhythm, normal heart sounds and intact distal pulses.   No murmur heard. Pulmonary/Chest: Effort normal and breath sounds normal. No respiratory distress. She exhibits no  tenderness.  Abdominal: Soft. She exhibits no distension. There is no tenderness. There is no rebound.  Musculoskeletal: She exhibits tenderness. She exhibits no edema.       Thoracic back: She exhibits tenderness and pain. She exhibits normal range of motion, no bony tenderness, no swelling, no deformity, no laceration, no spasm and normal pulse.       Lumbar back: She exhibits tenderness and pain. She exhibits normal range of motion, no swelling, no deformity, no laceration and normal pulse.       Back:       ttp of the thoracic paraspinal muscles.  Pain is reproduced with position change.    Neurological: She is alert and oriented to person, place, and time. She has normal strength. No sensory deficit. She exhibits normal muscle tone. Coordination and gait normal.  Reflex Scores:      Tricep reflexes are 2+ on the right side and 2+ on the left side.      Bicep reflexes are 2+ on the right side and 2+ on the left side.      Patellar reflexes are 2+ on the right side and 2+ on the left side.      Achilles reflexes are 2+ on the right side and 2+ on the left side. Skin: Skin is warm and dry.    ED Course  Procedures (including critical care time)  Labs Reviewed - No data to display Dg Chest 2 View  03/28/2012  *RADIOLOGY REPORT*  Clinical Data: Back pain  CHEST - 2 VIEW  Comparison: None  Findings: The heart size and mediastinal contours are within normal limits.  Both lungs are clear.  The visualized skeletal structures are unremarkable.  IMPRESSION: No active cardiopulmonary abnormalities.   Original Report Authenticated By: Signa Kell, M.D.      1. Back pain       MDM   Pain is reproduced with certain positions and resolves with rest.  No tachycardia, hypoxia or tachypnea to suggest PE, Wells PE score is 0.  Likely musculoskeletal.  Doubt PE  Patient is occassionally breast feeding her 42 month old daughter, I have advised her NOT to breast feed while taking the flexeril.  Pt  agrees to the care plan and verbalized understanding.  Pt agrees to f/u with her PMD  Prescribed: Naprosyn flexeril     Lizania Bouchard L. [place], [place] 03/30/12 1257

## 2012-03-30 NOTE — ED Provider Notes (Signed)
Medical screening examination/treatment/procedure(s) were performed by non-physician practitioner and as supervising physician I was immediately available for consultation/collaboration.   Carleene Cooper III, MD 03/30/12 2039

## 2012-07-01 ENCOUNTER — Encounter: Payer: Self-pay | Admitting: *Deleted

## 2012-07-01 ENCOUNTER — Other Ambulatory Visit: Payer: Self-pay | Admitting: Adult Health

## 2012-07-02 ENCOUNTER — Ambulatory Visit: Payer: Self-pay

## 2012-07-22 ENCOUNTER — Other Ambulatory Visit: Payer: Self-pay

## 2012-07-22 ENCOUNTER — Ambulatory Visit: Payer: Self-pay

## 2012-07-31 ENCOUNTER — Ambulatory Visit (INDEPENDENT_AMBULATORY_CARE_PROVIDER_SITE_OTHER): Payer: Medicaid Other | Admitting: Obstetrics & Gynecology

## 2012-07-31 ENCOUNTER — Encounter: Payer: Self-pay | Admitting: Obstetrics & Gynecology

## 2012-07-31 ENCOUNTER — Other Ambulatory Visit: Payer: Medicaid Other

## 2012-07-31 VITALS — BP 110/60 | Ht 65.0 in | Wt 144.0 lb

## 2012-07-31 DIAGNOSIS — Z3049 Encounter for surveillance of other contraceptives: Secondary | ICD-10-CM

## 2012-07-31 DIAGNOSIS — Z3200 Encounter for pregnancy test, result unknown: Secondary | ICD-10-CM

## 2012-07-31 DIAGNOSIS — Z309 Encounter for contraceptive management, unspecified: Secondary | ICD-10-CM

## 2012-07-31 MED ORDER — MEDROXYPROGESTERONE ACETATE 150 MG/ML IM SUSP
150.0000 mg | Freq: Once | INTRAMUSCULAR | Status: AC
  Administered 2012-07-31: 150 mg via INTRAMUSCULAR

## 2012-07-31 NOTE — Progress Notes (Signed)
[name], female   DOB: 01-Jan-1991, 21 y.o.   MRN: [id number] Pt had QHCG this morning and it was negative. No urine preg test was done.

## 2012-09-03 ENCOUNTER — Encounter: Payer: Medicaid Other | Admitting: Adult Health

## 2012-09-04 NOTE — Progress Notes (Signed)
This encounter was created in error - please disregard.

## 2012-09-30 ENCOUNTER — Encounter (HOSPITAL_COMMUNITY): Payer: Self-pay

## 2012-09-30 ENCOUNTER — Emergency Department (HOSPITAL_COMMUNITY)
Admission: EM | Admit: 2012-09-30 | Discharge: 2012-09-30 | Disposition: A | Discharge status: Home / Self Care | Payer: Medicaid Other | Attending: Emergency Medicine | Admitting: Emergency Medicine

## 2012-09-30 DIAGNOSIS — H53149 Visual discomfort, unspecified: Secondary | ICD-10-CM | POA: Insufficient documentation

## 2012-09-30 DIAGNOSIS — R51 Headache: Secondary | ICD-10-CM | POA: Insufficient documentation

## 2012-09-30 DIAGNOSIS — H9209 Otalgia, unspecified ear: Secondary | ICD-10-CM | POA: Insufficient documentation

## 2012-09-30 DIAGNOSIS — H9201 Otalgia, right ear: Secondary | ICD-10-CM

## 2012-09-30 MED ORDER — ANTIPYRINE-BENZOCAINE 5.4-1.4 % OT SOLN: 3.0000 [drp] | OTIC | Status: DC | PRN

## 2012-09-30 MED ORDER — METHOCARBAMOL 500 MG PO TABS: 500.0000 mg | ORAL_TABLET | Freq: Four times a day (QID) | ORAL | Status: DC

## 2012-09-30 NOTE — ED Notes (Signed)
Pt presents with c/o right earache x 3 days  And sharp intermittent left temporal pain and headaches x 1 month. Pt sitting in room with TV on extremely loud and playing/texting on cell phone.  Pt denies vision changes and sustained headache.  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 NAD noted

## 2012-09-30 NOTE — ED Provider Notes (Signed)
History    CSN: [id number] Arrival date & time 09/30/12  1110  First MD Initiated Contact with Patient 09/30/12 1148     Chief Complaint  Patient presents with  . Headache  . Otalgia   (Consider location/radiation/quality/duration/timing/severity/associated sxs/prior Treatment) HPI Comments: Kayla Nicholson is a 22 y.o. female who presents to the Emergency Department complaining of localized area of intermittent, sharp pain to her left scalp for one month and right ear pain for 3 days.  States her hearing is slightly diminished to the right ear.  She denies sinus pressure, fever, neck pain or stiffness, visual changes or vomiting.  She has not tried any home therapies.    The history is provided by the patient.   Past Medical History  Diagnosis Date  . Back pain    History reviewed. No pertinent past surgical history. Family History  Problem Relation Age of Onset  . Hypertension Mother   . Ovarian cysts Mother   . Cancer Sister     ovarian  . Hypertension Maternal Grandmother   . Diabetes Maternal Grandmother   . Ovarian cysts Maternal Grandmother    History  Substance Use Topics  . Smoking status: Never Smoker   . Smokeless tobacco: Never Used  . Alcohol Use: No   OB History   Grav Para Term Preterm Abortions TAB SAB Ect Mult Living   1 1  1      1      Review of Systems  Constitutional: Negative for fever, activity change and appetite change.  HENT: Positive for ear pain. Negative for congestion, facial swelling, sneezing, trouble swallowing, neck pain, neck stiffness, sinus pressure and ear discharge.   Eyes: Positive for photophobia. Negative for pain and visual disturbance.  Respiratory: Negative for shortness of breath.   Cardiovascular: Negative for chest pain.  Gastrointestinal: Negative for nausea and vomiting.  Skin: Negative for rash and wound.  Neurological: Positive for headaches. Negative for dizziness, syncope, facial asymmetry, speech difficulty,  weakness, light-headedness and numbness.  Psychiatric/Behavioral: Negative for confusion and decreased concentration.  All other systems reviewed and are negative.    Allergies  Bee venom  Home Medications   Current Outpatient Rx  Name  Route  Sig  Dispense  Refill  . medroxyPROGESTERone (DEPO-PROVERA) 150 MG/ML injection      TAKE TO DOCTOR'S OFFICE FOR INJECTION   1 mL   4    BP 105/74  Pulse 79  Temp(Src) 98.8 F (37.1 C) (Oral)  Resp 18  Ht 5\' 5"  (1.651 m)  Wt 140 lb (63.504 kg)  BMI 23.3 kg/m2  SpO2 100% Physical Exam  Nursing note and vitals reviewed. Constitutional: She is oriented to person, place, and time. She appears well-developed and well-nourished. No distress.  HENT:  Head: Normocephalic and atraumatic.  Right Ear: Tympanic membrane and ear canal normal. There is tenderness. No swelling. No mastoid tenderness. Tympanic membrane is not perforated, not erythematous, not retracted and not bulging. No hemotympanum.  Left Ear: Tympanic membrane and ear canal normal.  Nose: Nose normal.  Mouth/Throat: Uvula is midline, oropharynx is clear and moist and mucous membranes are normal.  Quarter sized area to the left scalp that patient points to as source of pain.  No swelling, erythema, lesions or cervical lymphadenopathy on exam.    Eyes: EOM are normal. Pupils are equal, round, and reactive to light.  Neck: Normal range of motion and phonation normal. Neck supple. No rigidity. No Brudzinski's sign and no Kernig's sign  noted.  Cardiovascular: Normal rate, regular rhythm, normal heart sounds and intact distal pulses.   No murmur heard. Pulmonary/Chest: Effort normal and breath sounds normal. No respiratory distress.  Musculoskeletal: Normal range of motion.  Lymphadenopathy:    She has no cervical adenopathy.  Neurological: She is alert and oriented to person, place, and time. No cranial nerve deficit or sensory deficit. She exhibits normal muscle tone.  Coordination and gait normal.  Reflex Scores:      Tricep reflexes are 2+ on the right side and 2+ on the left side.      Bicep reflexes are 2+ on the right side and 2+ on the left side. Skin: Skin is warm and dry.    ED Course  Procedures (including critical care time) Labs Reviewed - No data to display   MDM     Patient describes a sized area of intermittent pain to her left scalp. Patient is well-appearing. No focal neuro deficits. Watching TV and talking with her sister. No meningeal signs. Right otalgia without obvious otitis media will treat with Auralgan drops and Robaxin.  She agrees to f/u with her PMD if needed  Xayvier Vallez L. Trisha Mangle, PA-C 10/02/12 1252

## 2012-09-30 NOTE — ED Notes (Signed)
Pt c/o intermittent sharp pain in head for approx 1 month and r earache x 3 days.

## 2012-10-04 NOTE — ED Provider Notes (Signed)
Medical screening examination/treatment/procedure(s) were performed by non-physician practitioner and as supervising physician I was immediately available for consultation/collaboration.   Damiyah Ditmars W Brinton Brandel, MD 10/04/12 0031 

## 2012-10-23 ENCOUNTER — Other Ambulatory Visit (HOSPITAL_COMMUNITY)
Admission: RE | Admit: 2012-10-23 | Discharge: 2012-10-23 | Disposition: A | Discharge status: Home / Self Care | Payer: Medicaid Other | Source: Ambulatory Visit | Attending: Obstetrics & Gynecology | Admitting: Obstetrics & Gynecology

## 2012-10-23 ENCOUNTER — Encounter: Payer: Self-pay | Admitting: Obstetrics & Gynecology

## 2012-10-23 ENCOUNTER — Ambulatory Visit (INDEPENDENT_AMBULATORY_CARE_PROVIDER_SITE_OTHER): Payer: Medicaid Other | Admitting: Obstetrics & Gynecology

## 2012-10-23 VITALS — BP 104/66 | Ht 65.0 in | Wt 142.0 lb

## 2012-10-23 DIAGNOSIS — Z01419 Encounter for gynecological examination (general) (routine) without abnormal findings: Secondary | ICD-10-CM

## 2012-10-23 DIAGNOSIS — Z0000 Encounter for general adult medical examination without abnormal findings: Secondary | ICD-10-CM

## 2012-10-23 NOTE — Patient Instructions (Signed)

## 2012-10-23 NOTE — Progress Notes (Signed)
[name], female   DOB: 18-Aug-1990, 22 y.o.   MRN: [id number] Subjective:     Kayla EDLA PARA is a 22 y.o. female here for a routine exam.  Patient's last menstrual period was 09/07/2012. G1P0101 Current complaints: none.  Personal health questionnaire reviewed: yes.   Gynecologic History Patient's last menstrual period was 09/07/2012. Contraception: Depo-Provera injections Last Pap: 2013. Results were: normal Last mammogram: na. Results were: na  Obstetric History OB History   Grav Para Term Preterm Abortions TAB SAB Ect Mult Living   1 1  1      1      # Outc Date GA Lbr Len/2nd Wgt Sex Del Anes PTL Lv   1 PRE                The following portions of the patient's history were reviewed and updated as appropriate: allergies, current medications, past family history, past medical history, past social history, past surgical history and problem list.  Review of Systems  Review of Systems  Constitutional: Negative for fever, chills, weight loss, malaise/fatigue and diaphoresis.  HENT: Negative for hearing loss, ear pain, nosebleeds, congestion, sore throat, neck pain, tinnitus and ear discharge.   Eyes: Negative for blurred vision, double vision, photophobia, pain, discharge and redness.  Respiratory: Negative for cough, hemoptysis, sputum production, shortness of breath, wheezing and stridor.   Cardiovascular: Negative for chest pain, palpitations, orthopnea, claudication, leg swelling and PND.  Gastrointestinal: negative for abdominal pain. Negative for heartburn, nausea, vomiting, diarrhea, constipation, blood in stool and melena.  Genitourinary: Negative for dysuria, urgency, frequency, hematuria and flank pain.  Musculoskeletal: Negative for myalgias, back pain, joint pain and falls.  Skin: Negative for itching and rash.  Neurological: Negative for dizziness, tingling, tremors, sensory change, speech change, focal weakness, seizures, loss of consciousness, weakness  and headaches.  Endo/Heme/Allergies: Negative for environmental allergies and polydipsia. Does not bruise/bleed easily.  Psychiatric/Behavioral: Negative for depression, suicidal ideas, hallucinations, memory loss and substance abuse. The patient is not nervous/anxious and does not have insomnia.        Objective:    Physical Exam  Vitals reviewed. Constitutional: She is oriented to person, place, and time. She appears well-developed and well-nourished.  HENT:  Head: Normocephalic and atraumatic.        Right Ear: External ear normal.  Left Ear: External ear normal.  Nose: Nose normal.  Mouth/Throat: Oropharynx is clear and moist.  Eyes: Conjunctivae and EOM are normal. Pupils are equal, round, and reactive to light. Right eye exhibits no discharge. Left eye exhibits no discharge. No scleral icterus.  Neck: Normal range of motion. Neck supple. No tracheal deviation present. No thyromegaly present.  Cardiovascular: Normal rate, regular rhythm, normal heart sounds and intact distal pulses.  Exam reveals no gallop and no friction rub.   No murmur heard. Respiratory: Effort normal and breath sounds normal. No respiratory distress. She has no wheezes. She has no rales. She exhibits no tenderness.  GI: Soft. Bowel sounds are normal. She exhibits no distension and no mass. There is no tenderness. There is no rebound and no guarding.  Genitourinary:       Vulva is normal without lesions Vagina is pink moist without discharge Cervix normal in appearance and pap is done Uterus is normal size shape and contour Adnexa is negative with normal sized ovaries   Musculoskeletal: Normal range of motion. She exhibits no edema and no tenderness.  Neurological: She is alert and oriented to person,  place, and time. She has normal reflexes. She displays normal reflexes. No cranial nerve deficit. She exhibits normal muscle tone. Coordination normal.  Skin: Skin is warm and dry. No rash noted. No erythema. No  pallor.  Psychiatric: She has a normal mood and affect. Her behavior is normal. Judgment and thought content normal.       Assessment:    Healthy female exam.    Plan:    Contraception: switching to Nexplanon. Follow up in: 1 day.

## 2012-10-24 ENCOUNTER — Ambulatory Visit: Payer: Medicaid Other

## 2012-10-24 ENCOUNTER — Encounter: Payer: Medicaid Other | Admitting: Obstetrics & Gynecology

## 2012-10-30 ENCOUNTER — Encounter: Payer: Self-pay | Admitting: Obstetrics & Gynecology

## 2012-10-30 ENCOUNTER — Ambulatory Visit (INDEPENDENT_AMBULATORY_CARE_PROVIDER_SITE_OTHER): Payer: Medicaid Other | Admitting: Obstetrics & Gynecology

## 2012-10-30 VITALS — BP 100/60 | Wt 143.0 lb

## 2012-10-30 DIAGNOSIS — Z3200 Encounter for pregnancy test, result unknown: Secondary | ICD-10-CM

## 2012-10-30 DIAGNOSIS — Z3202 Encounter for pregnancy test, result negative: Secondary | ICD-10-CM

## 2012-10-30 DIAGNOSIS — Z30017 Encounter for initial prescription of implantable subdermal contraceptive: Secondary | ICD-10-CM

## 2012-10-30 NOTE — Progress Notes (Signed)
[name], female   DOB: 07-09-1990, 22 y.o.   MRN: [id number] Kayla is in today for Nexplanon insertion I saw her for yearly last week and we talked about extensively and she decided to proceed with that  She has not had intercourse  The left arm is evaluated and free of any infection 1% lidocaine is injected A small incision made Next benign rod placed without difficulty Steri-Strips placed crest  Followup as needed for yearly exam

## 2012-10-30 NOTE — Addendum Note (Signed)
Addended by: Richardson Chiquito on: 10/30/2012 04:13 PM   Modules accepted: Orders

## 2012-11-05 ENCOUNTER — Encounter: Payer: Medicaid Other | Admitting: Advanced Practice Midwife

## 2012-12-16 ENCOUNTER — Encounter: Payer: Self-pay | Admitting: Advanced Practice Midwife

## 2012-12-16 ENCOUNTER — Ambulatory Visit (INDEPENDENT_AMBULATORY_CARE_PROVIDER_SITE_OTHER): Payer: Medicaid Other | Admitting: Advanced Practice Midwife

## 2012-12-16 VITALS — BP 112/68 | Ht 65.0 in | Wt 145.0 lb

## 2012-12-16 DIAGNOSIS — N898 Other specified noninflammatory disorders of vagina: Secondary | ICD-10-CM

## 2012-12-16 LAB — POCT URINALYSIS DIPSTICK
Ketones, UA: NEGATIVE
Protein, UA: NEGATIVE

## 2012-12-16 MED ORDER — FLUCONAZOLE 150 MG PO TABS: 150.0000 mg | ORAL_TABLET | Freq: Once | ORAL | Status: DC

## 2012-12-16 NOTE — Progress Notes (Signed)
SUBJECTIVE:  22 y.o. female complains of white, creamy and curd-like vaginal discharge for 2 week(s). Denies abnormal vaginal bleeding or significant pelvic pain or fever. No UTI symptoms. Denies history of known exposure to STD.  No LMP recorded. Patient has had an implant.  OBJECTIVE:  She appears well, afebrile. Abdomen: benign, soft, nontender, no masses. Pelvic Exam: VULVA: vulvar excoriation exterally, vulvar erythema at introitus, VAGINA: vaginal discharge - white, creamy and curd-like, WET MOUNT done - results: hyphae, monilia. Urine dipstick: not done.  ASSESSMENT:  C. albicans vulvovaginitis  PLAN:  GC and chlamydia DNA  probe sent to lab. Treatment: abstain from coitus during course of treatment and Diflucan 150 mg X1; repeat in 3 days ROV prn if symptoms persist or worsen.

## 2012-12-17 LAB — GC/CHLAMYDIA PROBE AMP
CT Probe RNA: NEGATIVE
GC Probe RNA: NEGATIVE

## 2013-01-22 ENCOUNTER — Other Ambulatory Visit: Payer: Self-pay

## 2013-03-23 ENCOUNTER — Ambulatory Visit: Payer: Medicaid Other | Admitting: Adult Health

## 2013-04-09 ENCOUNTER — Other Ambulatory Visit (HOSPITAL_COMMUNITY): Payer: Self-pay | Admitting: Nurse Practitioner

## 2013-04-09 DIAGNOSIS — N6320 Unspecified lump in the left breast, unspecified quadrant: Secondary | ICD-10-CM

## 2013-04-22 ENCOUNTER — Ambulatory Visit (HOSPITAL_COMMUNITY)
Admission: RE | Admit: 2013-04-22 | Discharge: 2013-04-22 | Disposition: A | Discharge status: Home / Self Care | Payer: Medicaid Other | Source: Ambulatory Visit | Attending: Nurse Practitioner | Admitting: Nurse Practitioner

## 2013-04-22 ENCOUNTER — Other Ambulatory Visit (HOSPITAL_COMMUNITY): Payer: Self-pay | Admitting: Nurse Practitioner

## 2013-04-22 DIAGNOSIS — N6320 Unspecified lump in the left breast, unspecified quadrant: Secondary | ICD-10-CM

## 2013-04-22 DIAGNOSIS — N63 Unspecified lump in unspecified breast: Secondary | ICD-10-CM | POA: Insufficient documentation

## 2013-05-09 ENCOUNTER — Encounter (HOSPITAL_COMMUNITY): Payer: Self-pay | Admitting: Emergency Medicine

## 2013-05-09 ENCOUNTER — Emergency Department (HOSPITAL_COMMUNITY)
Admission: EM | Admit: 2013-05-09 | Discharge: 2013-05-09 | Disposition: A | Discharge status: Home / Self Care | Payer: Medicaid Other | Attending: Emergency Medicine | Admitting: Emergency Medicine

## 2013-05-09 DIAGNOSIS — Z862 Personal history of diseases of the blood and blood-forming organs and certain disorders involving the immune mechanism: Secondary | ICD-10-CM | POA: Insufficient documentation

## 2013-05-09 DIAGNOSIS — R6883 Chills (without fever): Secondary | ICD-10-CM | POA: Insufficient documentation

## 2013-05-09 DIAGNOSIS — M2550 Pain in unspecified joint: Secondary | ICD-10-CM | POA: Insufficient documentation

## 2013-05-09 DIAGNOSIS — R6889 Other general symptoms and signs: Secondary | ICD-10-CM | POA: Insufficient documentation

## 2013-05-09 DIAGNOSIS — Z87828 Personal history of other (healed) physical injury and trauma: Secondary | ICD-10-CM | POA: Insufficient documentation

## 2013-05-09 NOTE — ED Provider Notes (Signed)
CSN: [id number]     Arrival date & time 05/09/13  1236 History   First MD Initiated Contact with Patient 05/09/13 1307     Chief Complaint  Patient presents with  . Chills     (Consider location/radiation/quality/duration/timing/severity/associated sxs/prior Treatment) HPI Comments: Kayla Nicholson is a 23 year-old female with a past medical history of anemia, presenting the Emergency Department with a chief complaint of ongoing chills.  The patient reports that she is unable to wear a jacket a work and she gets cold due to the air blowing in from outside.  She states she stays cold. She reports that her supervisor asked her to remove a personal jacket since it was not from the company store.  The patient reports she is not going to pay $115 for a company jacket.  She reports calling their boss and he is requesting a note from a provider that states a history of anemia and she can wear her jacket at work. LNMP: No LMP recorded. Patient has had an implant. The patient also reports repetative ankle sprains.  No recent injury but reports she would like to have them evaluated.   The history is provided by the patient and medical records. No language interpreter was used.    Past Medical History  Diagnosis Date  . Back pain    History reviewed. No pertinent past surgical history. Family History  Problem Relation Age of Onset  . Hypertension Mother   . Ovarian cysts Mother   . Ovarian cysts Sister   . Hypertension Maternal Grandmother   . Diabetes Maternal Grandmother   . Ovarian cysts Maternal Grandmother    History  Substance Use Topics  . Smoking status: Never Smoker   . Smokeless tobacco: Never Used  . Alcohol Use: No     Comment: occassional   OB History   Grav Para Term Preterm Abortions TAB SAB Ect Mult Living   1 1  1      1      Review of Systems  Constitutional: Positive for chills. Negative for fever.  Respiratory: Negative for shortness of breath.   Cardiovascular:  Negative for chest pain.  Endocrine: Positive for cold intolerance.  Musculoskeletal: Positive for arthralgias.  Skin: Negative for rash.  Neurological: Negative for dizziness.      Allergies  Bee venom  Home Medications   Current Outpatient Rx  Name  Route  Sig  Dispense  Refill  . etonogestrel (NEXPLANON) 68 MG IMPL implant   Subcutaneous   Inject 1 each into the skin once.         . fluconazole (DIFLUCAN) 150 MG tablet   Oral   Take 1 tablet (150 mg total) by mouth once. Repeat in 3 days   2 tablet   2    BP 123/74  Pulse 75  Temp(Src) 98.7 F (37.1 C) (Oral)  Resp 18  Wt 140 lb (63.504 kg)  SpO2 98% Physical Exam  Nursing note and vitals reviewed. Constitutional: She is oriented to person, place, and time. She appears well-developed and well-nourished.  HENT:  Head: Normocephalic and atraumatic.  Eyes: Right eye exhibits no discharge. Left eye exhibits no discharge.  Neck: Normal range of motion. Neck supple.  Pulmonary/Chest: Effort normal. No respiratory distress.  Musculoskeletal: Normal range of motion. She exhibits no edema and no tenderness.  No tenderness or swelling to bilateral ankles. Good ROM. Able to ambulate without assistance and without a limp.  Neurological: She is alert and oriented  to person, place, and time.  Skin: Skin is warm and dry. She is not diaphoretic.  Psychiatric: She has a normal mood and affect. Her behavior is normal. Thought content normal.    ED Course  Procedures (including critical care time) Labs Review Labs Reviewed - No data to display Imaging Review No results found.  EKG Interpretation   None       MDM   Final diagnoses:  History of anemia   Patient reports history of anemia with pregnancy.  Most recent CBC shows hbg 12 and 13.  History of anemia, although during pregnancy, will write a note to wear personal jacket at work if cold. Repetitive ankle sprains, discussed ligament laxity after several sprains  and to follow up with an orthopedist. Discussed lab results and treatment plan with the patient. Return precautions given. Reports understanding and no other concerns at this time.  Patient is stable for discharge at this time.    Lorrine Kin, PA-C 05/09/13 1606

## 2013-05-09 NOTE — ED Notes (Signed)
Was wearing a coat at work and was told she could not wear a coat.  Pt states she is anemic and stays cold.  Needs a note stating she can wear a coat at work.  Also c/o weak ankles and wants her ankles checked while she is here.

## 2013-05-09 NOTE — Discharge Instructions (Signed)
Call for a follow up appointment with a Family or Primary Care Provider.  Return if Symptoms worsen.   Take medication as prescribed.   Anemia, Nonspecific  Anemia is a condition in which the concentration of red blood cells or hemoglobin in the blood is below normal. Hemoglobin is a substance in red blood cells that carries oxygen to the tissues of the body. Anemia results in not enough oxygen reaching these tissues.  CAUSES  Common causes of anemia include:  Excessive bleeding. Bleeding may be internal or external. This includes excessive bleeding from periods (in women) or from the intestine.  Poor nutrition.  Chronic kidney, thyroid, and liver disease.  Bone marrow disorders that decrease red blood cell production.  Cancer and treatments for cancer.  HIV, AIDS, and their treatments.  Spleen problems that increase red blood cell destruction.  Blood disorders.  Excess destruction of red blood cells due to infection, medicines, and autoimmune disorders. SIGNS AND SYMPTOMS  Minor weakness.  Dizziness.  Headache.  Palpitations.  Shortness of breath, especially with exercise.  Paleness.  Cold sensitivity.  Indigestion.  Nausea.  Difficulty sleeping.  Difficulty concentrating. Symptoms may occur suddenly or they may develop slowly.  DIAGNOSIS  Additional blood tests are often needed. These help your health care provider determine the best treatment. Your health care provider will check your stool for blood and look for other causes of blood loss.  TREATMENT  Treatment varies depending on the cause of the anemia. Treatment can include:  Supplements of iron, vitamin Q22, or folic acid.  Hormone medicines.  A blood transfusion. This may be needed if blood loss is severe.  Hospitalization. This may be needed if there is significant continual blood loss.  Dietary changes.  Spleen removal. HOME CARE INSTRUCTIONS  Keep all follow-up appointments. It often takes many weeks to correct  anemia, and having your health care provider check on your condition and your response to treatment is very important.  SEEK IMMEDIATE MEDICAL CARE IF:  You develop extreme weakness, shortness of breath, or chest pain.  You become dizzy or have trouble concentrating.  You develop heavy vaginal bleeding.  You develop a rash.  You have bloody or black, tarry stools.  You faint.  You vomit up blood.  You vomit repeatedly.  You have abdominal pain.  You have a fever or persistent symptoms for more than 2 3 days.  You have a fever and your symptoms suddenly get worse.  You are dehydrated.  MAKE SURE YOU:  Understand these instructions.  Will watch your condition.  Will get help right away if you are not doing well or get worse. Document Released: 04/12/2004 Document Revised: 11/05/2012 Document Reviewed: 08/29/2012  [facility] Patient Information 2014 [place].

## 2013-05-10 NOTE — ED Provider Notes (Signed)
Medical screening examination/treatment/procedure(s) were performed by non-physician practitioner and as supervising physician I was immediately available for consultation/collaboration.  EKG Interpretation   None         Lylianna Fraiser T Oceanna Arruda, MD 05/10/13 0658 

## 2013-06-05 ENCOUNTER — Telehealth: Payer: Self-pay | Admitting: *Deleted

## 2013-06-16 NOTE — Telephone Encounter (Signed)
Pt has an appt with Knute Neu, CNM for June 22, 2013.

## 2013-06-22 ENCOUNTER — Encounter: Payer: Self-pay | Admitting: Women's Health

## 2013-06-30 ENCOUNTER — Encounter: Payer: Self-pay | Admitting: Advanced Practice Midwife

## 2013-06-30 ENCOUNTER — Ambulatory Visit (INDEPENDENT_AMBULATORY_CARE_PROVIDER_SITE_OTHER): Payer: Medicaid Other | Admitting: Advanced Practice Midwife

## 2013-06-30 VITALS — BP 108/62 | Ht 65.0 in | Wt 153.0 lb

## 2013-06-30 DIAGNOSIS — Z3046 Encounter for surveillance of implantable subdermal contraceptive: Secondary | ICD-10-CM

## 2013-06-30 DIAGNOSIS — N760 Acute vaginitis: Secondary | ICD-10-CM

## 2013-06-30 DIAGNOSIS — A499 Bacterial infection, unspecified: Secondary | ICD-10-CM

## 2013-06-30 DIAGNOSIS — B9689 Other specified bacterial agents as the cause of diseases classified elsewhere: Secondary | ICD-10-CM

## 2013-06-30 MED ORDER — METRONIDAZOLE 500 MG PO TABS: 500.0000 mg | ORAL_TABLET | Freq: Two times a day (BID) | ORAL | Status: DC

## 2013-06-30 NOTE — Progress Notes (Signed)
[place] M Luevanos 23 y.o. Filed Vitals:   06/30/13 1008  BP: 108/62     Has gained about 15 lbs since insertions of Nexplanon and wants it removed. 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  Aware that she will return to fertile quickly after removal.  Also C/O vaginal irritation and fishy odor after sex for a week or so.  SSE:  Thin white discharge with amine odor.  Patient given informed consent for removal of her Implanon, time out was performed.  Signed copy in the chart.  Appropriate time out taken. Implanon site identified.  Area prepped in usual sterile fashon. One cc of 1% lidocaine was used to anesthetize the area at the distal end of the implant. A small stab incision was made right beside the implant on the distal portion.  The implanon rod was grasped using hemostats and removed without difficulty.  There was less than 3 cc blood loss. There were no complications.  A small amount of antibiotic ointment and steri-strips were applied over the small incision.  A pressure bandage was applied to reduce any bruising.  The patient tolerated the procedure well and was given post procedure instructions.  Meds ordered this encounter  Medications  . metroNIDAZOLE (FLAGYL) 500 MG tablet    Sig: Take 1 tablet (500 mg total) by mouth 2 (two) times daily.    Dispense:  14 tablet    Refill:  0    Order Specific Question:  Supervising Provider    Answer:  Tania Ade H [[id number]]

## 2013-07-01 ENCOUNTER — Telehealth: Payer: Self-pay | Admitting: Advanced Practice Midwife

## 2013-07-01 MED ORDER — METRONIDAZOLE 500 MG PO TABS: 500.0000 mg | ORAL_TABLET | Freq: Two times a day (BID) | ORAL | Status: DC

## 2013-07-01 NOTE — Telephone Encounter (Signed)
rx resent to [facility] in [place]

## 2013-07-01 NOTE — Telephone Encounter (Signed)
Pt aware Rx resent to pharmacy.

## 2013-07-19 ENCOUNTER — Emergency Department (HOSPITAL_COMMUNITY)
Admission: EM | Admit: 2013-07-19 | Discharge: 2013-07-19 | Disposition: A | Discharge status: Home / Self Care | Payer: Medicaid Other | Attending: Emergency Medicine | Admitting: Emergency Medicine

## 2013-07-19 ENCOUNTER — Encounter (HOSPITAL_COMMUNITY): Payer: Self-pay | Admitting: Emergency Medicine

## 2013-07-19 DIAGNOSIS — Z79899 Other long term (current) drug therapy: Secondary | ICD-10-CM | POA: Insufficient documentation

## 2013-07-19 DIAGNOSIS — R630 Anorexia: Secondary | ICD-10-CM | POA: Insufficient documentation

## 2013-07-19 DIAGNOSIS — R110 Nausea: Secondary | ICD-10-CM | POA: Insufficient documentation

## 2013-07-19 DIAGNOSIS — R000 Tachycardia, unspecified: Secondary | ICD-10-CM | POA: Insufficient documentation

## 2013-07-19 DIAGNOSIS — J069 Acute upper respiratory infection, unspecified: Secondary | ICD-10-CM | POA: Insufficient documentation

## 2013-07-19 LAB — RAPID STREP SCREEN (MED CTR MEBANE ONLY): STREPTOCOCCUS, GROUP A SCREEN (DIRECT): NEGATIVE

## 2013-07-19 MED ORDER — ONDANSETRON 8 MG PO TBDP: 8.0000 mg | ORAL_TABLET | Freq: Three times a day (TID) | ORAL | Status: DC | PRN

## 2013-07-19 MED ORDER — HYDROCODONE-ACETAMINOPHEN 7.5-325 MG/15ML PO SOLN
15.0000 mL | Freq: Four times a day (QID) | ORAL | Status: DC | PRN
Start: 2013-07-19 — End: 2013-12-22

## 2013-07-19 MED ORDER — ACETAMINOPHEN 325 MG PO TABS
650.0000 mg | ORAL_TABLET | Freq: Once | ORAL | Status: AC
  Administered 2013-07-19: 650 mg via ORAL
  Filled 2013-07-19: qty 2

## 2013-07-19 MED ORDER — OXYCODONE-ACETAMINOPHEN 5-325 MG PO TABS
1.0000 | ORAL_TABLET | Freq: Once | ORAL | Status: AC
  Administered 2013-07-19: 1 via ORAL
  Filled 2013-07-19: qty 1

## 2013-07-19 NOTE — Discharge Instructions (Signed)

## 2013-07-19 NOTE — ED Notes (Signed)
Pt c/o sore throat and body aches since Friday.  Reports started having productive cough this morning around 2am.

## 2013-07-19 NOTE — ED Provider Notes (Signed)
CSN: [id number]     Arrival date & time 07/19/13  1005 History  This chart was scribed for [employer]. Alvino Chapel, MD by Roxan Diesel, ED scribe.  This patient was seen in room APA02/APA02 and the patient's care was started at 10:28 AM.   Chief Complaint  Patient presents with  . Cough  . Sore Throat    The history is provided by the patient. No language interpreter was used.    HPI Comments: Kayla Nicholson is a 23 y.o. female who presents to the Emergency Department complaining of 2 days of sore throat with associated cough, fever, headache, loss of appetite, and generalized body aches.  Pt states her pain is constant but worsened by swallowing.  She also reports rhinorrhea and nasal congestion.  She has been coughing up sputum since this morning.  She also reports mild nausea but denies vomiting.  Pt notes her daughter has been sick with a cough recently but no other similar symptoms.  She denies other sick contacts.  Pt is generally healthy.  She denies possibility of pregnancy.    Past Medical History  Diagnosis Date  . Back pain     History reviewed. No pertinent past surgical history.   Family History  Problem Relation Age of Onset  . Hypertension Mother   . Ovarian cysts Mother   . Ovarian cysts Sister   . Hypertension Maternal Grandmother   . Diabetes Maternal Grandmother   . Ovarian cysts Maternal Grandmother     History  Substance Use Topics  . Smoking status: Never Smoker   . Smokeless tobacco: Never Used  . Alcohol Use: Yes     Comment: occassional    OB History   Grav Para Term Preterm Abortions TAB SAB Ect Mult Living   1 1  1      1        Review of Systems  Constitutional: Positive for fever and appetite change.  HENT: Positive for congestion and rhinorrhea.   Respiratory: Positive for cough.   Gastrointestinal: Positive for nausea. Negative for vomiting.  Musculoskeletal: Positive for myalgias.  Neurological: Positive for headaches.       Allergies  Bee venom  Home Medications   Prior to Admission medications   Medication Sig Start Date End Date Taking? Authorizing Provider  fluconazole (DIFLUCAN) 150 MG tablet Take 1 tablet (150 mg total) by mouth once. Repeat in 3 days 12/16/12   Christin Fudge, CNM  metroNIDAZOLE (FLAGYL) 500 MG tablet Take 1 tablet (500 mg total) by mouth 2 (two) times daily. 07/01/13   Christin Fudge, CNM   BP 115/72  Pulse 113  Temp(Src) 101 F (38.3 C) (Oral)  Resp 20  Ht 5\' 5"  (1.651 m)  Wt 155 lb (70.308 kg)  BMI 25.79 kg/m2  SpO2 95%  LMP 07/15/2013   Physical Exam  Nursing note and vitals reviewed. Constitutional: She is oriented to person, place, and time. She appears well-developed and well-nourished. No distress.  HENT:  Head: Normocephalic and atraumatic.  Mouth/Throat: Uvula is midline. Posterior oropharyngeal erythema (mild) present. No oropharyngeal exudate.  Nasal congestion  Eyes: EOM are normal.  Neck: Neck supple. No tracheal deviation present.  Anterior cervical lymphadenopathy  Cardiovascular: Tachycardia present.   Pulmonary/Chest: Effort normal and breath sounds normal. No respiratory distress. She has no wheezes. She has no rales.  Abdominal: There is no tenderness.  Musculoskeletal: Normal range of motion. She exhibits no edema.  No peripheral edema  Lymphadenopathy:    She  has cervical adenopathy.  Neurological: She is alert and oriented to person, place, and time.  Skin: Skin is warm and dry.  Warm to the touch  Psychiatric: She has a normal mood and affect. Her behavior is normal.    ED Course  Procedures (including critical care time)  COORDINATION OF CARE: 10:33 AM-Discussed treatment plan which includes strep screen with pt at bedside and pt agreed to plan.     Labs Review Labs Reviewed  RAPID STREP SCREEN  CULTURE, GROUP A STREP    Imaging Review No results found.   EKG Interpretation None      MDM    Final diagnoses:  URI (upper respiratory infection)    URI. Strep negative. Symptomatic treatment    I personally performed the services described in this documentation, which was scribed in my presence. The recorded information has been reviewed and is accurate.     Jasper Riling. Alvino Chapel, MD 07/21/13 1221

## 2013-07-21 LAB — CULTURE, GROUP A STREP

## 2013-07-22 ENCOUNTER — Emergency Department (HOSPITAL_COMMUNITY)
Admission: EM | Admit: 2013-07-22 | Discharge: 2013-07-22 | Disposition: A | Discharge status: Home / Self Care | Payer: Medicaid Other | Attending: Emergency Medicine | Admitting: Emergency Medicine

## 2013-07-22 ENCOUNTER — Encounter (HOSPITAL_COMMUNITY): Payer: Self-pay | Admitting: Emergency Medicine

## 2013-07-22 DIAGNOSIS — Z79899 Other long term (current) drug therapy: Secondary | ICD-10-CM | POA: Insufficient documentation

## 2013-07-22 DIAGNOSIS — H109 Unspecified conjunctivitis: Secondary | ICD-10-CM | POA: Insufficient documentation

## 2013-07-22 MED ORDER — ERYTHROMYCIN 5 MG/GM OP OINT
TOPICAL_OINTMENT | Freq: Three times a day (TID) | OPHTHALMIC | Status: DC
  Administered 2013-07-22: 13:00:00 via OPHTHALMIC
  Filled 2013-07-22: qty 3.5

## 2013-07-22 NOTE — ED Notes (Signed)
Pt presents with redness and irritation to right eye. Pt reports "gunk" on eye when she woke up.

## 2013-07-22 NOTE — ED Provider Notes (Signed)
Medical screening examination/treatment/procedure(s) were performed by non-physician practitioner and as supervising physician I was immediately available for consultation/collaboration.   EKG Interpretation None       Nat Christen, MD 07/22/13 1455

## 2013-07-22 NOTE — ED Provider Notes (Signed)
CSN: [id number]     Arrival date & time 07/22/13  1213 History   First MD Initiated Contact with Patient 07/22/13 1223     Chief Complaint  Patient presents with  . Conjunctivitis     (Consider location/radiation/quality/duration/timing/severity/associated sxs/prior Treatment) HPI Comments: Pt c/o redness and irritation to the right eye that started 2 days ago. No injury and no vision problems. Doesn't wear contact lenses. Denies cold symptoms. Drainage from the area and it is worse in morning  The history is provided by the patient. No language interpreter was used.    Past Medical History  Diagnosis Date  . Back pain    History reviewed. No pertinent past surgical history. Family History  Problem Relation Age of Onset  . Hypertension Mother   . Ovarian cysts Mother   . Ovarian cysts Sister   . Hypertension Maternal Grandmother   . Diabetes Maternal Grandmother   . Ovarian cysts Maternal Grandmother    History  Substance Use Topics  . Smoking status: Never Smoker   . Smokeless tobacco: Never Used  . Alcohol Use: Yes     Comment: occassional   OB History   Grav Para Term Preterm Abortions TAB SAB Ect Mult Living   1 1  1      1      Review of Systems  Constitutional: Negative.   Respiratory: Negative.   Cardiovascular: Negative.       Allergies  Bee venom  Home Medications   Prior to Admission medications   Medication Sig Start Date End Date Taking? Authorizing Provider  HYDROcodone-acetaminophen (HYCET) 7.5-325 mg/15 ml solution Take 15 mLs by mouth every 6 (six) hours as needed for moderate pain. 07/19/13   Jasper Riling. Kamaal Cast, MD  ibuprofen (ADVIL,MOTRIN) 200 MG tablet Take 400 mg by mouth daily as needed for mild pain.    Historical Provider, MD  metroNIDAZOLE (FLAGYL) 500 MG tablet Take 1 tablet (500 mg total) by mouth 2 (two) times daily. 07/01/13   Christin Fudge, CNM  ondansetron (ZOFRAN-ODT) 8 MG disintegrating tablet Take 1 tablet (8 mg total)  by mouth every 8 (eight) hours as needed for nausea or vomiting. 07/19/13   Jasper Riling. Dashonna Chagnon, MD   BP 127/71  Pulse 86  Temp(Src) 98.3 F (36.8 C) (Oral)  Resp 18  SpO2 100%  LMP 07/15/2013 Physical Exam  Nursing note and vitals reviewed. Constitutional: She appears well-developed and well-nourished.  HENT:  Right Ear: External ear normal.  Left Ear: External ear normal.  Mouth/Throat: Oropharynx is clear and moist.  Eyes: EOM are normal. Pupils are equal, round, and reactive to light. Left eye exhibits discharge. Left conjunctiva is injected.  Cardiovascular: Normal rate and regular rhythm.   Pulmonary/Chest: Effort normal.    ED Course  Procedures (including critical care time) Labs Review Labs Reviewed - No data to display  Imaging Review No results found.   EKG Interpretation None      MDM   Final diagnoses:  Conjunctivitis    No visual problems. Will treat for conjunctivitis. Not a contact lense wearer    Glendell Docker, NP 07/22/13 1246

## 2013-07-22 NOTE — Discharge Instructions (Signed)

## 2013-12-21 ENCOUNTER — Ambulatory Visit: Payer: Medicaid Other | Admitting: Adult Health

## 2013-12-22 ENCOUNTER — Ambulatory Visit (INDEPENDENT_AMBULATORY_CARE_PROVIDER_SITE_OTHER): Payer: Medicaid Other | Admitting: Adult Health

## 2013-12-22 ENCOUNTER — Encounter: Payer: Self-pay | Admitting: Adult Health

## 2013-12-22 VITALS — BP 102/60 | Ht 65.0 in | Wt 147.0 lb

## 2013-12-22 DIAGNOSIS — B9689 Other specified bacterial agents as the cause of diseases classified elsewhere: Secondary | ICD-10-CM | POA: Insufficient documentation

## 2013-12-22 DIAGNOSIS — N760 Acute vaginitis: Secondary | ICD-10-CM

## 2013-12-22 DIAGNOSIS — N898 Other specified noninflammatory disorders of vagina: Secondary | ICD-10-CM

## 2013-12-22 DIAGNOSIS — A499 Bacterial infection, unspecified: Secondary | ICD-10-CM

## 2013-12-22 HISTORY — DX: Other specified bacterial agents as the cause of diseases classified elsewhere: B96.89

## 2013-12-22 LAB — POCT WET PREP (WET MOUNT): WBC, Wet Prep HPF POC: POSITIVE

## 2013-12-22 MED ORDER — METRONIDAZOLE 500 MG PO TABS: 500.0000 mg | ORAL_TABLET | Freq: Two times a day (BID) | ORAL | Status: DC

## 2013-12-22 NOTE — Patient Instructions (Signed)
Bacterial Vaginosis Bacterial vaginosis is a vaginal infection that occurs when the normal balance of bacteria in the vagina is disrupted. It results from an overgrowth of certain bacteria. This is the most common vaginal infection in women of childbearing age. Treatment is important to prevent complications, especially in pregnant women, as it can cause a premature delivery. CAUSES  Bacterial vaginosis is caused by an increase in harmful bacteria that are normally present in smaller amounts in the vagina. Several different kinds of bacteria can cause bacterial vaginosis. However, the reason that the condition develops is not fully understood. RISK FACTORS Certain activities or behaviors can put you at an increased risk of developing bacterial vaginosis, including:  Having a new sex partner or multiple sex partners.  Douching.  Using an intrauterine device (IUD) for contraception. Women do not get bacterial vaginosis from toilet seats, bedding, swimming pools, or contact with objects around them. SIGNS AND SYMPTOMS  Some women with bacterial vaginosis have no signs or symptoms. Common symptoms include:  Grey vaginal discharge.  A fishlike odor with discharge, especially after sexual intercourse.  Itching or burning of the vagina and vulva.  Burning or pain with urination. DIAGNOSIS  Your health care provider will take a medical history and examine the vagina for signs of bacterial vaginosis. A sample of vaginal fluid may be taken. Your health care provider will look at this sample under a microscope to check for bacteria and abnormal cells. A vaginal pH test may also be done.  TREATMENT  Bacterial vaginosis may be treated with antibiotic medicines. These may be given in the form of a pill or a vaginal cream. A second round of antibiotics may be prescribed if the condition comes back after treatment.  HOME CARE INSTRUCTIONS   Only take over-the-counter or prescription medicines as  directed by your health care provider.  If antibiotic medicine was prescribed, take it as directed. Make sure you finish it even if you start to feel better.  Do not have sex until treatment is completed.  Tell all sexual partners that you have a vaginal infection. They should see their health care provider and be treated if they have problems, such as a mild rash or itching.  Practice safe sex by using condoms and only having one sex partner. SEEK MEDICAL CARE IF:   Your symptoms are not improving after 3 days of treatment.  You have increased discharge or pain.  You have a fever. MAKE SURE YOU:   Understand these instructions.  Will watch your condition.  Will get help right away if you are not doing well or get worse. FOR MORE INFORMATION  Centers for Disease Control and Prevention, Division of STD Prevention: [employer] American Sexual Health Association (ASHA): www.ashastd.org  Document Released: 03/05/2005 Document Revised: 12/24/2012 Document Reviewed: 10/15/2012 [facility] Patient Information 2015 [place], [place]. This information is not intended to replace advice given to you by your health care provider. Make sure you discuss any questions you have with your health care provider. Take flagyl no sex no alcohol

## 2013-12-22 NOTE — Progress Notes (Signed)
Subjective:     [name], female   DOB: 09/18/1990, 23 y.o.   MRN: [id number]  HPI Kayla is a 23 year old black female in complaining of vaginal discharge has been out in field for training for [employer] and did not have bath for 4 days, thinks it could be yeast.  Review of Systems See HPI Reviewed past medical,surgical, social and family history. Reviewed medications and allergies.     Objective:   Physical Exam BP 102/60  Ht 5\' 5"  (1.651 m)  Wt 147 lb (66.679 kg)  BMI 24.46 kg/m2  LMP 12/02/2013   Skin warm and dry.Pelvic: external genitalia is normal in appearance, vagina: white discharge with slight odor, cervix:smooth and bulbous, uterus: normal size, shape and contour, non tender, no masses felt, adnexa: no masses or tenderness noted. Wet prep: + for clue cells and +WBCs. GC/CHL obtained.  Assessment:     Vaginal discharge BV    Plan:    GC/CHL sent Rx flagyl 500 mg 1 bid x 7 days, no alcohol, review handout on BV   Follow up prn  Use condoms

## 2013-12-23 LAB — GC/CHLAMYDIA PROBE AMP
CT PROBE, AMP APTIMA: NEGATIVE
GC Probe RNA: NEGATIVE

## 2014-01-06 ENCOUNTER — Telehealth: Payer: Self-pay | Admitting: Adult Health

## 2014-01-06 ENCOUNTER — Ambulatory Visit: Payer: Medicaid Other | Admitting: Advanced Practice Midwife

## 2014-01-06 NOTE — Telephone Encounter (Signed)
Spoke with pt. Pt thinks she has a yeast infection. PCP won't approve for her to be seen here. I spoke to [place], [place] and she advised to use generic Monistat. Can get it at [facility]. I advised if this don't help, let us know. Pt voiced understanding. [place]

## 2014-01-18 ENCOUNTER — Encounter: Payer: Self-pay | Admitting: Adult Health

## 2014-01-31 ENCOUNTER — Encounter (HOSPITAL_COMMUNITY): Payer: Self-pay | Admitting: Cardiology

## 2014-01-31 ENCOUNTER — Emergency Department (HOSPITAL_COMMUNITY)
Admission: EM | Admit: 2014-01-31 | Discharge: 2014-01-31 | Disposition: A | Discharge status: Home / Self Care | Payer: Medicaid Other | Attending: Emergency Medicine | Admitting: Emergency Medicine

## 2014-01-31 DIAGNOSIS — L292 Pruritus vulvae: Secondary | ICD-10-CM | POA: Diagnosis present

## 2014-01-31 DIAGNOSIS — N390 Urinary tract infection, site not specified: Secondary | ICD-10-CM | POA: Diagnosis not present

## 2014-01-31 DIAGNOSIS — Z30012 Encounter for prescription of emergency contraception: Secondary | ICD-10-CM | POA: Diagnosis not present

## 2014-01-31 DIAGNOSIS — Z79899 Other long term (current) drug therapy: Secondary | ICD-10-CM | POA: Diagnosis not present

## 2014-01-31 DIAGNOSIS — Z792 Long term (current) use of antibiotics: Secondary | ICD-10-CM | POA: Diagnosis not present

## 2014-01-31 DIAGNOSIS — B9689 Other specified bacterial agents as the cause of diseases classified elsewhere: Secondary | ICD-10-CM

## 2014-01-31 DIAGNOSIS — Z8739 Personal history of other diseases of the musculoskeletal system and connective tissue: Secondary | ICD-10-CM | POA: Insufficient documentation

## 2014-01-31 DIAGNOSIS — N760 Acute vaginitis: Secondary | ICD-10-CM | POA: Diagnosis not present

## 2014-01-31 DIAGNOSIS — Z3202 Encounter for pregnancy test, result negative: Secondary | ICD-10-CM | POA: Diagnosis not present

## 2014-01-31 LAB — URINALYSIS, ROUTINE W REFLEX MICROSCOPIC
Bilirubin Urine: NEGATIVE
Glucose, UA: NEGATIVE mg/dL
Hgb urine dipstick: NEGATIVE
Nitrite: NEGATIVE
Protein, ur: NEGATIVE mg/dL
SPECIFIC GRAVITY, URINE: 1.025 (ref 1.005–1.030)
Urobilinogen, UA: 0.2 mg/dL (ref 0.0–1.0)
pH: 6.5 (ref 5.0–8.0)

## 2014-01-31 LAB — URINE MICROSCOPIC-ADD ON

## 2014-01-31 LAB — POC URINE PREG, ED: PREG TEST UR: NEGATIVE

## 2014-01-31 LAB — WET PREP, GENITAL
Trich, Wet Prep: NONE SEEN
YEAST WET PREP: NONE SEEN

## 2014-01-31 LAB — HIV ANTIBODY (ROUTINE TESTING W REFLEX): HIV: NONREACTIVE

## 2014-01-31 LAB — RPR

## 2014-01-31 MED ORDER — NITROFURANTOIN MONOHYD MACRO 100 MG PO CAPS: 100.0000 mg | ORAL_CAPSULE | Freq: Two times a day (BID) | ORAL | Status: DC

## 2014-01-31 MED ORDER — LEVONORGESTREL 1.5 MG PO TABS: 1.5000 mg | ORAL_TABLET | Freq: Once | ORAL | Status: DC

## 2014-01-31 MED ORDER — CLINDAMYCIN PHOSPHATE 2 % VA CREA: 1.0000 | TOPICAL_CREAM | Freq: Every day | VAGINAL | Status: AC

## 2014-01-31 MED ORDER — METRONIDAZOLE 500 MG PO TABS: 500.0000 mg | ORAL_TABLET | Freq: Two times a day (BID) | ORAL | Status: DC
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
## 2014-01-31 NOTE — Discharge Instructions (Signed)
Bacterial Vaginosis Bacterial vaginosis is a vaginal infection that occurs when the normal balance of bacteria in the vagina is disrupted. It results from an overgrowth of certain bacteria. This is the most common vaginal infection in women of childbearing age. Treatment is important to prevent complications, especially in pregnant women, as it can cause a premature delivery. CAUSES  Bacterial vaginosis is caused by an increase in harmful bacteria that are normally present in smaller amounts in the vagina. Several different kinds of bacteria can cause bacterial vaginosis. However, the reason that the condition develops is not fully understood. RISK FACTORS Certain activities or behaviors can put you at an increased risk of developing bacterial vaginosis, including:  Having a new sex partner or multiple sex partners.  Douching.  Using an intrauterine device (IUD) for contraception. Women do not get bacterial vaginosis from toilet seats, bedding, swimming pools, or contact with objects around them. SIGNS AND SYMPTOMS  Some women with bacterial vaginosis have no signs or symptoms. Common symptoms include:  Grey vaginal discharge.  A fishlike odor with discharge, especially after sexual intercourse.  Itching or burning of the vagina and vulva.  Burning or pain with urination. DIAGNOSIS  Your health care provider will take a medical history and examine the vagina for signs of bacterial vaginosis. A sample of vaginal fluid may be taken. Your health care provider will look at this sample under a microscope to check for bacteria and abnormal cells. A vaginal pH test may also be done.  TREATMENT  Bacterial vaginosis may be treated with antibiotic medicines. These may be given in the form of a pill or a vaginal cream. A second round of antibiotics may be prescribed if the condition comes back after treatment.  HOME CARE INSTRUCTIONS   Only take over-the-counter or prescription medicines as  directed by your health care provider.  If antibiotic medicine was prescribed, take it as directed. Make sure you finish it even if you start to feel better.  Do not have sex until treatment is completed.  Tell all sexual partners that you have a vaginal infection. They should see their health care provider and be treated if they have problems, such as a mild rash or itching.  Practice safe sex by using condoms and only having one sex partner. SEEK MEDICAL CARE IF:   Your symptoms are not improving after 3 days of treatment.  You have increased discharge or pain.  You have a fever. MAKE SURE YOU:   Understand these instructions.  Will watch your condition.  Will get help right away if you are not doing well or get worse. FOR MORE INFORMATION  Centers for Disease Control and Prevention, Division of STD Prevention: [employer]raiser[employer]raud[employer] American Sexual Health Association (ASHA): www.ashastd.org  Document Released: 03/05/2005 Document Revised: 12/24/2012 Document Reviewed: 10/15/2012 [facility] Patient Information 2015 [place], [place]. This information is not intended to replace advice given to you by your health care provider. Make sure you discuss any questions you have with your health care provider.  Emergency Contraception Emergency contraceptives prevent pregnancy after unprotected sexual intercourse. They can also be used:  When a condom breaks.  After a sexual assault.  If you forgot to take your birth control pills.  When inadequate protection occurs with sexual intercourse. Usually, emergency contraception is a pill or combination of pills taken right after sex or up to 5 days after unprotected sex. It is most effective the sooner you take the pills after having sexual intercourse. Most types of emergency contraceptive  pills are available without a prescription. One type requires a prescription from your health care provider. Also, young women under age 50 need a prescription  for most types of emergency contraception. Check with your pharmacist. Do not use emergency contraception as your only form of birth control. These pills do not protect against sexually transmitted infections (STIs).  Emergency contraception will not work if you are already pregnant and will not harm the baby if you are pregnant. Emergency contraception does not cause an abortion. The pills work by preventing the ovaries from releasing an egg (ovulation) or the fertilization of an egg. Taking St. John's wort, certain antibiotic medicines, and certain anticonvulsant medicines may make these pills less effective. Discuss with your health care provider the possible side effects of emergency contraceptives. These may include:  Abdominal pain and cramps.  Breast tenderness.  Headache.  Dizziness.  Fatigue.  Irregular bleeding or spotting. TYPES OF EMERGENCY CONTRACEPTIVES  Some types of emergency contraceptive pills contain estrogen and progesterone in higher doses.  Some types just contain progesterone. They are available as a single pill or two pills taken 12-24 hours apart.  One type of pill is not a hormone. It prevents the hormone progesterone from having its normal effect on ovulation and the lining of the uterus.  An intrauterine device (IUD) may be used.This T-shaped device is also used as a form of birth control. It is inserted into the uterus to prevent pregnancy. The copper IUD can also be used as emergency contraception if inserted within 5 days of having unprotected intercourse. HOME CARE INSTRUCTIONS   Eat something before taking the emergency contraceptive pills.  Lie down for a couple of hours if you become tired or dizzy.  Continue using birth control until you start your menstrual period. SEEK MEDICAL CARE IF:   You throw up (vomit) within 2 hours after taking the pill. You will have to take another pill.  You need treatment for nausea, vomiting, headache, or  abdominal cramps.  You have not had a menstrual period 21 days after taking the pill.  You are having irregular bleeding or spotting. SEEK IMMEDIATE MEDICAL CARE IF:   You have chest pain.  You have leg pain.  You have numbness or weakness of your arms or legs.  You have slurred speech.  You have visual problems. Document Released: 05/14/2001 Document Revised: 07/20/2013 Document Reviewed: 08/17/2012 [facility] Patient Information 2015 [place], [place]. This information is not intended to replace advice given to you by your health care provider. Make sure you discuss any questions you have with your health care provider.
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
## 2014-02-01 LAB — GC/CHLAMYDIA PROBE AMP
CT Probe RNA: NEGATIVE
GC PROBE AMP APTIMA: NEGATIVE

## 2014-02-02 LAB — URINE CULTURE
Colony Count: NO GROWTH
Culture: NO GROWTH

## 2014-02-18 ENCOUNTER — Ambulatory Visit (INDEPENDENT_AMBULATORY_CARE_PROVIDER_SITE_OTHER): Payer: Medicaid Other | Admitting: Adult Health

## 2014-02-18 ENCOUNTER — Encounter: Payer: Self-pay | Admitting: Adult Health

## 2014-02-18 VITALS — BP 104/76 | Ht 65.0 in | Wt 156.0 lb

## 2014-02-18 DIAGNOSIS — Z30011 Encounter for initial prescription of contraceptive pills: Secondary | ICD-10-CM

## 2014-02-18 DIAGNOSIS — Z309 Encounter for contraceptive management, unspecified: Secondary | ICD-10-CM

## 2014-02-18 HISTORY — DX: Encounter for contraceptive management, unspecified: Z30.9

## 2014-02-18 MED ORDER — NORETHIN ACE-ETH ESTRAD-FE 1-20 MG-MCG(24) PO CHEW: 1.0000 | CHEWABLE_TABLET | Freq: Every day | ORAL | Status: DC

## 2014-02-18 NOTE — Progress Notes (Signed)
Subjective:     [name], female   DOB: 10-04-1990, 23 y.o.   MRN: [id number]  HPI Kayla is a a 23 year old black female in to discuss birth control.  Review of Systems See HPI Reviewed past medical,surgical, social and family history. Reviewed medications and allergies.     Objective:   Physical Exam BP 104/76 mmHg  Ht 5\' 5"  (1.651 m)  Wt 156 lb (70.761 kg)  BMI 25.96 kg/m2  LMP 02/16/2014   Discussed options and she want to try the pill, aware of risks/benefits.  Assessment:     Contraceptive management    Plan:     Rx minastrin take 1 daily starting Sunday refill x 11 Follow up prn Review handout on OC use

## 2014-02-18 NOTE — Patient Instructions (Signed)
Oral Contraception Use Oral contraceptive pills (OCPs) are medicines taken to prevent pregnancy. OCPs work by preventing the ovaries from releasing eggs. The hormones in OCPs also cause the cervical mucus to thicken, preventing the sperm from entering the uterus. The hormones also cause the uterine lining to become thin, not allowing a fertilized egg to attach to the inside of the uterus. OCPs are highly effective when taken exactly as prescribed. However, OCPs do not prevent sexually transmitted diseases (STDs). Safe sex practices, such as using condoms along with an OCP, can help prevent STDs. Before taking OCPs, you may have a physical exam and Pap test. Your health care provider may also order blood tests if necessary. Your health care provider will make sure you are a good candidate for oral contraception. Discuss with your health care provider the possible side effects of the OCP you may be prescribed. When starting an OCP, it can take 2 to 3 months for the body to adjust to the changes in hormone levels in your body.  HOW TO TAKE ORAL CONTRACEPTIVE PILLS Your health care provider may advise you on how to start taking the first cycle of OCPs. Otherwise, you can:   Start on day 1 of your menstrual period. You will not need any backup contraceptive protection with this start time.   Start on the first Sunday after your menstrual period or the day you get your prescription. In these cases, you will need to use backup contraceptive protection for the first week.   Start the pill at any time of your cycle. If you take the pill within 5 days of the start of your period, you are protected against pregnancy right away. In this case, you will not need a backup form of birth control. If you start at any other time of your menstrual cycle, you will need to use another form of birth control for 7 days. If your OCP is the type called a minipill, it will protect you from pregnancy after taking it for 2 days (48  hours). After you have started taking OCPs:   If you forget to take 1 pill, take it as soon as you remember. Take the next pill at the regular time.   If you miss 2 or more pills, call your health care provider because different pills have different instructions for missed doses. Use backup birth control until your next menstrual period starts.   If you use a 28-day pack that contains inactive pills and you miss 1 of the last 7 pills (pills with no hormones), it will not matter. Throw away the rest of the non-hormone pills and start a new pill pack.  No matter which day you start the OCP, you will always start a new pack on that same day of the week. Have an extra pack of OCPs and a backup contraceptive method available in case you miss some pills or lose your OCP pack.  HOME CARE INSTRUCTIONS   Do not smoke.   Always use a condom to protect against STDs. OCPs do not protect against STDs.   Use a calendar to mark your menstrual period days.   Read the information and directions that came with your OCP. Talk to your health care provider if you have questions.  SEEK MEDICAL CARE IF:   You develop nausea and vomiting.   You have abnormal vaginal discharge or bleeding.   You develop a rash.   You miss your menstrual period.   You are losing   your hair.   You need treatment for mood swings or depression.   You get dizzy when taking the OCP.   You develop acne from taking the OCP.   You become pregnant.  SEEK IMMEDIATE MEDICAL CARE IF:   You develop chest pain.   You develop shortness of breath.   You have an uncontrolled or severe headache.   You develop numbness or slurred speech.   You develop visual problems.   You develop pain, redness, and swelling in the legs.  Document Released: 02/22/2011 Document Revised: 07/20/2013 Document Reviewed: 08/24/2012 [facility] Patient Information 2015 [place], [place]. This information is not intended to replace  advice given to you by your health care provider. Make sure you discuss any questions you have with your health care provider. Start OCs Sunday and use condoms Follow up prn

## 2014-03-09 ENCOUNTER — Encounter: Payer: Self-pay | Admitting: Adult Health

## 2014-03-09 ENCOUNTER — Ambulatory Visit (INDEPENDENT_AMBULATORY_CARE_PROVIDER_SITE_OTHER): Payer: Medicaid Other | Admitting: Adult Health

## 2014-03-09 VITALS — BP 100/52 | Ht 65.0 in | Wt 156.0 lb

## 2014-03-09 DIAGNOSIS — N898 Other specified noninflammatory disorders of vagina: Secondary | ICD-10-CM

## 2014-03-09 DIAGNOSIS — A499 Bacterial infection, unspecified: Secondary | ICD-10-CM

## 2014-03-09 DIAGNOSIS — B9689 Other specified bacterial agents as the cause of diseases classified elsewhere: Secondary | ICD-10-CM

## 2014-03-09 DIAGNOSIS — N760 Acute vaginitis: Secondary | ICD-10-CM

## 2014-03-09 LAB — POCT WET PREP (WET MOUNT): WBC, Wet Prep HPF POC: POSITIVE

## 2014-03-09 MED ORDER — FLUCONAZOLE 150 MG PO TABS: ORAL_TABLET | ORAL | Status: DC

## 2014-03-09 MED ORDER — METRONIDAZOLE 0.75 % VA GEL: 1.0000 | Freq: Two times a day (BID) | VAGINAL | Status: DC

## 2014-03-09 NOTE — Patient Instructions (Signed)
Bacterial Vaginosis Bacterial vaginosis is a vaginal infection that occurs when the normal balance of bacteria in the vagina is disrupted. It results from an overgrowth of certain bacteria. This is the most common vaginal infection in women of childbearing age. Treatment is important to prevent complications, especially in pregnant women, as it can cause a premature delivery. CAUSES  Bacterial vaginosis is caused by an increase in harmful bacteria that are normally present in smaller amounts in the vagina. Several different kinds of bacteria can cause bacterial vaginosis. However, the reason that the condition develops is not fully understood. RISK FACTORS Certain activities or behaviors can put you at an increased risk of developing bacterial vaginosis, including:  Having a new sex partner or multiple sex partners.  Douching.  Using an intrauterine device (IUD) for contraception. Women do not get bacterial vaginosis from toilet seats, bedding, swimming pools, or contact with objects around them. SIGNS AND SYMPTOMS  Some women with bacterial vaginosis have no signs or symptoms. Common symptoms include:  Grey vaginal discharge.  A fishlike odor with discharge, especially after sexual intercourse.  Itching or burning of the vagina and vulva.  Burning or pain with urination. DIAGNOSIS  Your health care provider will take a medical history and examine the vagina for signs of bacterial vaginosis. A sample of vaginal fluid may be taken. Your health care provider will look at this sample under a microscope to check for bacteria and abnormal cells. A vaginal pH test may also be done.  TREATMENT  Bacterial vaginosis may be treated with antibiotic medicines. These may be given in the form of a pill or a vaginal cream. A second round of antibiotics may be prescribed if the condition comes back after treatment.  HOME CARE INSTRUCTIONS   Only take over-the-counter or prescription medicines as  directed by your health care provider.  If antibiotic medicine was prescribed, take it as directed. Make sure you finish it even if you start to feel better.  Do not have sex until treatment is completed.  Tell all sexual partners that you have a vaginal infection. They should see their health care provider and be treated if they have problems, such as a mild rash or itching.  Practice safe sex by using condoms and only having one sex partner. SEEK MEDICAL CARE IF:   Your symptoms are not improving after 3 days of treatment.  You have increased discharge or pain.  You have a fever. MAKE SURE YOU:   Understand these instructions.  Will watch your condition.  Will get help right away if you are not doing well or get worse. FOR MORE INFORMATION  Centers for Disease Control and Prevention, Division of STD Prevention: [employer]raiser[employer]raud[employer] American Sexual Health Association (ASHA): www.ashastd.org  Document Released: 03/05/2005 Document Revised: 12/24/2012 Document Reviewed: 10/15/2012 [facility] Patient Information 2015 [place], [place]. This information is not intended to replace advice given to you by your health care provider. Make sure you discuss any questions you have with your health care provider. Follow up prn

## 2014-03-09 NOTE — Progress Notes (Signed)
Subjective:     [name], female   DOB: 1990-06-19, 23 y.o.   MRN: [id number]  HPI Kayla is a 23 year old black female in complaining of vaginal discharge and itching.She used a new soap while on weekend get a away.  Review of Systems See HPI Reviewed past medical,surgical, social and family history. Reviewed medications and allergies.      Objective:   Physical Exam BP 100/52 mmHg  Ht 5\' 5"  (1.651 m)  Wt 156 lb (70.761 kg)  BMI 25.96 kg/m2  LMP 02/16/2014 Skin warm and dry.Pelvic: external genitalia is normal in appearance, no lesions, vagina: white discharge with slight odor,red sidewalls, cervix:smooth and bulbous, uterus: normal size, shape and contour, non tender, no masses felt, adnexa: no masses or tenderness noted. Wet prep: + for clue cells and +WBCs.     Assessment:     Vaginal discharge  BV    Plan:     Rx metrogel use 1 applicator in vagina at hs Rx diflucan 150 mg #2 take 1 now and 1 in 3 days with 1 refill Review handout on BV Follow up prn

## 2014-04-08 ENCOUNTER — Ambulatory Visit: Payer: Medicaid Other | Admitting: Adult Health

## 2014-04-27 ENCOUNTER — Ambulatory Visit (INDEPENDENT_AMBULATORY_CARE_PROVIDER_SITE_OTHER): Payer: Medicaid Other | Admitting: Adult Health

## 2014-04-27 ENCOUNTER — Encounter: Payer: Self-pay | Admitting: Adult Health

## 2014-04-27 VITALS — BP 118/80 | Ht 65.0 in | Wt 152.0 lb

## 2014-04-27 DIAGNOSIS — Z30013 Encounter for initial prescription of injectable contraceptive: Secondary | ICD-10-CM

## 2014-04-27 DIAGNOSIS — B379 Candidiasis, unspecified: Secondary | ICD-10-CM

## 2014-04-27 DIAGNOSIS — N898 Other specified noninflammatory disorders of vagina: Secondary | ICD-10-CM

## 2014-04-27 HISTORY — DX: Candidiasis, unspecified: B37.9

## 2014-04-27 LAB — POCT WET PREP (WET MOUNT)

## 2014-04-27 MED ORDER — MEDROXYPROGESTERONE ACETATE 150 MG/ML IM SUSP: 150.0000 mg | Freq: Once | INTRAMUSCULAR | Status: DC

## 2014-04-27 MED ORDER — FLUCONAZOLE 150 MG PO TABS: ORAL_TABLET | ORAL | Status: DC

## 2014-04-27 NOTE — Patient Instructions (Signed)
Monilial Vaginitis Vaginitis in a soreness, swelling and redness (inflammation) of the vagina and vulva. Monilial vaginitis is not a sexually transmitted infection. CAUSES  Yeast vaginitis is caused by yeast (candida) that is normally found in your vagina. With a yeast infection, the candida has overgrown in number to a point that upsets the chemical balance. SYMPTOMS   White, thick vaginal discharge.  Swelling, itching, redness and irritation of the vagina and possibly the lips of the vagina (vulva).  Burning or painful urination.  Painful intercourse. DIAGNOSIS  Things that may contribute to monilial vaginitis are:  Postmenopausal and virginal states.  Pregnancy.  Infections.  Being tired, sick or stressed, especially if you had monilial vaginitis in the past.  Diabetes. Good control will help lower the chance.  Birth control pills.  Tight fitting garments.  Using bubble bath, feminine sprays, douches or deodorant tampons.  Taking certain medications that kill germs (antibiotics).  Sporadic recurrence can occur if you become ill. TREATMENT  Your caregiver will give you medication.  There are several kinds of anti monilial vaginal creams and suppositories specific for monilial vaginitis. For recurrent yeast infections, use a suppository or cream in the vagina 2 times a week, or as directed.  Anti-monilial or steroid cream for the itching or irritation of the vulva may also be used. Get your caregiver's permission.  Painting the vagina with methylene blue solution may help if the monilial cream does not work.  Eating yogurt may help prevent monilial vaginitis. HOME CARE INSTRUCTIONS   Finish all medication as prescribed.  Do not have sex until treatment is completed or after your caregiver tells you it is okay.  Take warm sitz baths.  Do not douche.  Do not use tampons, especially scented ones.  Wear cotton underwear.  Avoid tight pants and panty  hose.  Tell your sexual partner that you have a yeast infection. They should go to their caregiver if they have symptoms such as mild rash or itching.  Your sexual partner should be treated as well if your infection is difficult to eliminate.  Practice safer sex. Use condoms.  Some vaginal medications cause latex condoms to fail. Vaginal medications that harm condoms are:  Cleocin cream.  Butoconazole (Femstat).  Terconazole (Terazol) vaginal suppository.  Miconazole (Monistat) (may be purchased over the counter). SEEK MEDICAL CARE IF:   You have a temperature by mouth above 102 F (38.9 C).  The infection is getting worse after 2 days of treatment.  The infection is not getting better after 3 days of treatment.  You develop blisters in or around your vagina.  You develop vaginal bleeding, and it is not your menstrual period.  You have pain when you urinate.  You develop intestinal problems.  You have pain with sexual intercourse. Document Released: 12/13/2004 Document Revised: 05/28/2011 Document Reviewed: 08/27/2008 [facility] Patient Information 2015 [place], [place]. This information is not intended to replace advice given to you by your health care provider. Make sure you discuss any questions you have with your health care provider. NO sex  Return in 1 week for stat QHCG in am then depo in pm, conitnue OCs

## 2014-04-27 NOTE — Progress Notes (Signed)
Subjective:     [name], female   DOB: 1991/01/26, 24 y.o.   MRN: [id number]  HPI Kayla is a 24 year old black female in complaining of vaginal irritation ? Yeast and wants to get on depo, forgets the pill.Last period 2/2, last sex 03/13/14.Has used depo before.  Review of Systems Patient denies any headaches, hearing loss, fatigue, blurred vision, shortness of breath, chest pain, abdominal pain, problems with bowel movements, urination, or intercourse. No joint pain or mood swings.See HPI for positives Reviewed past medical,surgical, social and family history. Reviewed medications and allergies.     Objective:   Physical Exam BP 118/80 mmHg  Ht 5\' 5"  (1.651 m)  Wt 152 lb (68.947 kg)  BMI 25.29 kg/m2  LMP 04/20/2014   Skin warm and dry.Pelvic: external genitalia is normal in appearance no lesions, vagina: white discharge without odor,urethra has no lesions or masses noted, cervix:smooth, uterus: normal size, shape and contour, non tender, no masses felt, adnexa: no masses or tenderness noted. Bladder is non tender and no masses felt. Wet prep: + for yeast and few WBCs. Keep taking minastrin and will Rx depo and get in next week for stat QHCG in am and depo in pm.  Assessment:     Vaginal discharge Yeast Contraceptive management    Plan:     Rx Depo provera 150 mg, disp.# 1 vail for IM injection every 3 months in office wit 4 refills Rx diflucan 150 mg #2 1 now and 1 in 3 days with 1 refill No sex Return in 1 week(needs Tuesday) for stat [facility] in am and then return in pm for depo Review handout on yeast

## 2014-05-04 ENCOUNTER — Other Ambulatory Visit: Payer: Medicaid Other

## 2014-05-04 ENCOUNTER — Ambulatory Visit: Payer: Medicaid Other

## 2014-05-13 ENCOUNTER — Encounter: Payer: Self-pay | Admitting: Adult Health

## 2014-05-13 ENCOUNTER — Ambulatory Visit (INDEPENDENT_AMBULATORY_CARE_PROVIDER_SITE_OTHER): Payer: Medicaid Other | Admitting: Adult Health

## 2014-05-13 VITALS — BP 118/72 | HR 73 | Ht 65.0 in | Wt 155.5 lb

## 2014-05-13 DIAGNOSIS — Z30013 Encounter for initial prescription of injectable contraceptive: Secondary | ICD-10-CM

## 2014-05-13 NOTE — Patient Instructions (Signed)
Call with next period for depo Go get depo

## 2014-05-13 NOTE — Progress Notes (Signed)
Subjective:     [name], female   DOB: 11-06-1990, 24 y.o.   MRN: [id number]  HPI Kayla is a 24 year old black female wants depo, missed last appt, and will wait til starts next period to get, instead of having blood check for [facility].  Review of Systems  Patient denies any headaches, hearing loss, fatigue, blurred vision, shortness of breath, chest pain, abdominal pain, problems with bowel movements, urination, or intercourse. No joint pain or mood swings. Reviewed past medical,surgical, social and family history. Reviewed medications and allergies.     Objective:   Physical Exam BP 118/72 mmHg  Pulse 73  Ht 5\' 5"  (1.651 m)  Wt 155 lb 8 oz (70.534 kg)  BMI 25.88 kg/m2  LMP 04/20/2014   Skin warm and dry.  Lungs: clear to ausculation bilaterally. Cardiovascular: regular rate and rhythm.She is aware Rx for depo at [facility]  Assessment:     Contraceptive management    Plan:     Call with next period for first depo Go get depo now

## 2014-05-17 ENCOUNTER — Telehealth: Payer: Self-pay | Admitting: Adult Health

## 2014-05-17 ENCOUNTER — Other Ambulatory Visit: Payer: Self-pay | Admitting: *Deleted

## 2014-05-17 MED ORDER — MEDROXYPROGESTERONE ACETATE 150 MG/ML IM SUSP: 150.0000 mg | Freq: Once | INTRAMUSCULAR | Status: DC

## 2014-05-17 NOTE — Telephone Encounter (Signed)
Pt states [facility] does not have depo provera 150 mg. It is on back order. RX called to [facility], [place] per order in Epic.

## 2014-05-18 ENCOUNTER — Ambulatory Visit (INDEPENDENT_AMBULATORY_CARE_PROVIDER_SITE_OTHER): Payer: Medicaid Other | Admitting: *Deleted

## 2014-05-18 DIAGNOSIS — Z3042 Encounter for surveillance of injectable contraceptive: Secondary | ICD-10-CM

## 2014-05-18 DIAGNOSIS — Z3202 Encounter for pregnancy test, result negative: Secondary | ICD-10-CM | POA: Diagnosis not present

## 2014-05-18 LAB — POCT URINE PREGNANCY: Preg Test, Ur: NEGATIVE

## 2014-05-18 MED ORDER — MEDROXYPROGESTERONE ACETATE 150 MG/ML IM SUSP
150.0000 mg | Freq: Once | INTRAMUSCULAR | Status: AC
  Administered 2014-05-18: 150 mg via INTRAMUSCULAR

## 2014-05-18 NOTE — Progress Notes (Signed)
[name], female   DOB: February 10, 1991, 24 y.o.   MRN: [id number] Depo Provera 150 mg IM given left deltoid, no complications, negative pregnancy test, LMP 05/16/3014. Pt to return in 12 weeks for next injection.

## 2014-06-24 ENCOUNTER — Ambulatory Visit (INDEPENDENT_AMBULATORY_CARE_PROVIDER_SITE_OTHER): Payer: Medicaid Other | Admitting: Advanced Practice Midwife

## 2014-06-24 ENCOUNTER — Encounter: Payer: Self-pay | Admitting: Advanced Practice Midwife

## 2014-06-24 VITALS — BP 120/80 | Ht 65.0 in | Wt 155.5 lb

## 2014-06-24 DIAGNOSIS — N898 Other specified noninflammatory disorders of vagina: Secondary | ICD-10-CM | POA: Diagnosis not present

## 2014-06-24 MED ORDER — CLINDAMYCIN PHOSPHATE 100 MG VA SUPP: 100.0000 mg | Freq: Every day | VAGINAL | Status: DC

## 2014-06-24 NOTE — Progress Notes (Signed)
[facility] Visit  Patient name: Kayla M Lieder MRN [id number]  Date of birth: 11-11-1990  CC & HPI:  Kayla Nicholson is a 24 y.o. African American female presenting today for C/O external vaginal irritaion for 5 days.  Tried switching soaps, did not help.  No itching, discharge. On depo  Pertinent History Reviewed:  Medical & Surgical Hx:   Past Medical History  Diagnosis Date  . Back pain   . BV (bacterial vaginosis) 12/22/2013  . Contraceptive management 02/18/2014  . Yeast infection 04/27/2014   History reviewed. No pertinent past surgical history. Family History  Problem Relation Age of Onset  . Hypertension Mother   . Ovarian cysts Sister   . Hypertension Maternal Grandmother   . Diabetes Maternal Grandmother   . Ovarian cysts Maternal Grandmother   . Hernia Daughter     Current outpatient prescriptions:  .  medroxyPROGESTERone (DEPO-PROVERA) 150 MG/ML injection, Inject 1 mL (150 mg total) into the muscle once., Disp: 1 mL, Rfl: 4 .  clindamycin (CLEOCIN) 100 MG vaginal suppository, Place 1 suppository (100 mg total) vaginally at bedtime., Disp: 3 suppository, Rfl: 0 Social History: Reviewed -  reports that she has never smoked. She has never used smokeless tobacco.  Review of Systems:   Constitutional: Negative for fever and chills Eyes: Negative for visual disturbances Respiratory: Negative for shortness of breath, dyspnea Cardiovascular: Negative for chest pain or palpitations  Gastrointestinal: Negative for vomiting, diarrhea and constipation; no abdominal pain Genitourinary: Negative for dysuria and urgency, vaginal irritation or itching Musculoskeletal: Negative for back pain, joint pain, myalgias  Neurological: Negative for dizziness and headaches    Objective Findings:  Vitals: BP 120/80 mmHg  Ht 5\' 5"  (1.651 m)  Wt 155 lb 8 oz (70.534 kg)  BMI 25.88 kg/m2  Physical Examination: General appearance - alert, well appearing, and in no distress Mental  status -oriented to person, place, and time Abdomen - soft, nontender, nondistended, no masses  Pelvic -  Vulva normal appearing without erythema Vagina:  Small amount white discharge wtihout odor.  Sidewalls sl erythemous Cervix:  nonfriable Wet prep:  No clue or yeast; few WBC, no trich No results found for this or any previous visit (from the past 24 hour(s)).     Assessment & Plan:  A:   Vaginitis P:  Rx cleocin 100mg  ovules qhs X3  Samples of Luvena vaginal wash BID given   CRESENZO-DISHMAN,Alesia Oshields CNM 06/24/2014 2:52 PM

## 2014-07-13 ENCOUNTER — Telehealth: Payer: Self-pay | Admitting: Advanced Practice Midwife

## 2014-07-13 NOTE — Telephone Encounter (Signed)
Pt states that she has ordered the luvena vaginal wash from [email or web address]

## 2014-07-16 ENCOUNTER — Ambulatory Visit: Payer: Medicaid Other | Admitting: Adult Health

## 2014-07-20 ENCOUNTER — Ambulatory Visit: Payer: Medicaid Other | Admitting: Adult Health

## 2014-07-27 ENCOUNTER — Encounter: Payer: Self-pay | Admitting: Adult Health

## 2014-07-27 ENCOUNTER — Ambulatory Visit (INDEPENDENT_AMBULATORY_CARE_PROVIDER_SITE_OTHER): Payer: Medicaid Other | Admitting: Adult Health

## 2014-07-27 VITALS — BP 120/60 | HR 76 | Ht 65.0 in | Wt 162.5 lb

## 2014-07-27 DIAGNOSIS — N926 Irregular menstruation, unspecified: Secondary | ICD-10-CM

## 2014-07-27 DIAGNOSIS — R350 Frequency of micturition: Secondary | ICD-10-CM

## 2014-07-27 DIAGNOSIS — R1032 Left lower quadrant pain: Secondary | ICD-10-CM | POA: Diagnosis not present

## 2014-07-27 DIAGNOSIS — R319 Hematuria, unspecified: Secondary | ICD-10-CM

## 2014-07-27 DIAGNOSIS — Z3202 Encounter for pregnancy test, result negative: Secondary | ICD-10-CM | POA: Diagnosis not present

## 2014-07-27 HISTORY — DX: Frequency of micturition: R35.0

## 2014-07-27 HISTORY — DX: Left lower quadrant pain: R10.32

## 2014-07-27 HISTORY — DX: Irregular menstruation, unspecified: N92.6

## 2014-07-27 LAB — POCT URINALYSIS DIPSTICK
Glucose, UA: NEGATIVE
Leukocytes, UA: NEGATIVE
Nitrite, UA: NEGATIVE
PROTEIN UA: NEGATIVE

## 2014-07-27 LAB — POCT URINE PREGNANCY: PREG TEST UR: NEGATIVE

## 2014-07-27 NOTE — Progress Notes (Signed)
Subjective:     [name], female   DOB: [birth year], 24 y.o.   MRN: [id number]  HPI Kayla is a 24 year old black female in complaining of urinary frequency and irregular bleeding and some discomfort LLQ.She is on depo and uses luvena for vaginal dryness.She says she only pees a little.  Review of Systems +urinary frequency,LLQ discomfort +irregular bleeding, +vaginal dryness, all other systems negative Reviewed past medical,surgical, social and family history. Reviewed medications and allergies.     Objective:   Physical Exam BP 120/60 mmHg  Pulse 76  Ht 5\' 5"  (1.651 m)  Wt 162 lb 8 oz (73.71 kg)  BMI 27.04 kg/m2  LMP 04/25/2016urine trace blood, UPT negative, Skin warm and dry.Pelvic: external genitalia is normal in appearance no lesions, vagina: scant discharge without odor,urethra has no lesions or masses noted, cervix:smooth and bulbous, uterus: normal size, shape and contour, non tender, no masses felt, adnexa: no masses, LLQ tenderness noted. Bladder is non tender and no masses felt.    Assessment:     Urinary frequency  Irregular bleeding LLQ discomfort Hematuria     Plan:     UA C&S  Return in 1 week for gyn [place] Review handout on ovarian cyst Increase water and void often

## 2014-07-27 NOTE — Patient Instructions (Addendum)
Ovarian Cyst An ovarian cyst is a fluid-filled sac that forms on an ovary. The ovaries are small organs that produce eggs in women. Various types of cysts can form on the ovaries. Most are not cancerous. Many do not cause problems, and they often go away on their own. Some may cause symptoms and require treatment. Common types of ovarian cysts include:  Functional cysts--These cysts may occur every month during the menstrual cycle. This is normal. The cysts usually go away with the next menstrual cycle if the woman does not get pregnant. Usually, there are no symptoms with a functional cyst.  Endometrioma cysts--These cysts form from the tissue that lines the uterus. They are also called "chocolate cysts" because they become filled with blood that turns brown. This type of cyst can cause pain in the lower abdomen during intercourse and with your menstrual period.  Cystadenoma cysts--This type develops from the cells on the outside of the ovary. These cysts can get very big and cause lower abdomen pain and pain with intercourse. This type of cyst can twist on itself, cut off its blood supply, and cause severe pain. It can also easily rupture and cause a lot of pain.  Dermoid cysts--This type of cyst is sometimes found in both ovaries. These cysts may contain different kinds of body tissue, such as skin, teeth, hair, or cartilage. They usually do not cause symptoms unless they get very big.  Theca lutein cysts--These cysts occur when too much of a certain hormone (human chorionic gonadotropin) is produced and overstimulates the ovaries to produce an egg. This is most common after procedures used to assist with the conception of a baby (in vitro fertilization). CAUSES   Fertility drugs can cause a condition in which multiple large cysts are formed on the ovaries. This is called ovarian hyperstimulation syndrome.  A condition called polycystic ovary syndrome can cause hormonal imbalances that can lead to  nonfunctional ovarian cysts. SIGNS AND SYMPTOMS  Many ovarian cysts do not cause symptoms. If symptoms are present, they may include:  Pelvic pain or pressure.  Pain in the lower abdomen.  Pain during sexual intercourse.  Increasing girth (swelling) of the abdomen.  Abnormal menstrual periods.  Increasing pain with menstrual periods.  Stopping having menstrual periods without being pregnant. DIAGNOSIS  These cysts are commonly found during a routine or annual pelvic exam. Tests may be ordered to find out more about the cyst. These tests may include:  Ultrasound.  X-ray of the pelvis.  CT scan.  MRI.  Blood tests. TREATMENT  Many ovarian cysts go away on their own without treatment. Your health care provider may want to check your cyst regularly for 2-3 months to see if it changes. For women in menopause, it is particularly important to monitor a cyst closely because of the higher rate of ovarian cancer in menopausal women. When treatment is needed, it may include any of the following:  A procedure to drain the cyst (aspiration). This may be done using a long needle and ultrasound. It can also be done through a laparoscopic procedure. This involves using a thin, lighted tube with a tiny camera on the end (laparoscope) inserted through a small incision.  Surgery to remove the whole cyst. This may be done using laparoscopic surgery or an open surgery involving a larger incision in the lower abdomen.  Hormone treatment or birth control pills. These methods are sometimes used to help dissolve a cyst. HOME CARE INSTRUCTIONS   Only take over-the-counter   or prescription medicines as directed by your health care provider.  Follow up with your health care provider as directed.  Get regular pelvic exams and Pap tests. SEEK MEDICAL CARE IF:   Your periods are late, irregular, or painful, or they stop.  Your pelvic pain or abdominal pain does not go away.  Your abdomen becomes  larger or swollen.  You have pressure on your bladder or trouble emptying your bladder completely.  You have pain during sexual intercourse.  You have feelings of fullness, pressure, or discomfort in your stomach.  You lose weight for no apparent reason.  You feel generally ill.  You become constipated.  You lose your appetite.  You develop acne.  You have an increase in body and facial hair.  You are gaining weight, without changing your exercise and eating habits.  You think you are pregnant. SEEK IMMEDIATE MEDICAL CARE IF:   You have increasing abdominal pain.  You feel sick to your stomach (nauseous), and you throw up (vomit).  You develop a fever that comes on suddenly.  You have abdominal pain during a bowel movement.  Your menstrual periods become heavier than usual. MAKE SURE YOU:  Understand these instructions.  Will watch your condition.  Will get help right away if you are not doing well or get worse. Document Released: 03/05/2005 Document Revised: 03/10/2013 Document Reviewed: 11/10/2012 [facility] Patient Information 2015 [place], [place]. This information is not intended to replace advice given to you by your health care provider. Make sure you discuss any questions you have with your health care provider. Return in 1 week for [place]  Increase water and void often

## 2014-07-28 LAB — URINALYSIS, ROUTINE W REFLEX MICROSCOPIC
BILIRUBIN UA: NEGATIVE
GLUCOSE, UA: NEGATIVE
Ketones, UA: NEGATIVE
LEUKOCYTES UA: NEGATIVE
Nitrite, UA: NEGATIVE
PH UA: 6 (ref 5.0–7.5)
PROTEIN UA: NEGATIVE
RBC, UA: NEGATIVE
Specific Gravity, UA: 1.023 (ref 1.005–1.030)
Urobilinogen, Ur: 0.2 mg/dL (ref 0.2–1.0)

## 2014-07-29 LAB — URINE CULTURE: Organism ID, Bacteria: NO GROWTH

## 2014-08-03 ENCOUNTER — Other Ambulatory Visit: Payer: Self-pay | Admitting: Adult Health

## 2014-08-03 DIAGNOSIS — R1032 Left lower quadrant pain: Secondary | ICD-10-CM

## 2014-08-03 DIAGNOSIS — N926 Irregular menstruation, unspecified: Secondary | ICD-10-CM

## 2014-08-04 ENCOUNTER — Ambulatory Visit (INDEPENDENT_AMBULATORY_CARE_PROVIDER_SITE_OTHER): Payer: Medicaid Other

## 2014-08-04 ENCOUNTER — Other Ambulatory Visit: Payer: Self-pay | Admitting: Adult Health

## 2014-08-04 DIAGNOSIS — R1032 Left lower quadrant pain: Secondary | ICD-10-CM

## 2014-08-04 DIAGNOSIS — N926 Irregular menstruation, unspecified: Secondary | ICD-10-CM

## 2014-08-04 DIAGNOSIS — D495 Neoplasm of unspecified behavior of other genitourinary organs: Secondary | ICD-10-CM

## 2014-08-04 NOTE — Progress Notes (Signed)
US PELVIS tv & ta, normal anteverted uterus,EEC 5.21mm,normal lt ov, rt adnexal mass w/cystic and solid components 13.3 x 10.4 x 9.4cm,no separate ovarian tissue seen,art and venous flow was noted

## 2014-08-05 ENCOUNTER — Telehealth: Payer: Self-pay | Admitting: Adult Health

## 2014-08-05 DIAGNOSIS — N83201 Unspecified ovarian cyst, right side: Secondary | ICD-10-CM

## 2014-08-05 NOTE — Telephone Encounter (Signed)
Pt aware of [place] results and large rt complex ovarian  cyst will get CA 125 in am if negative will get [place] in 6 weeks in F/U

## 2014-08-05 NOTE — Telephone Encounter (Signed)
Spoke with pt. Pt had an [place] yesterday and wants to know results. Can you call her or does she need an appt? Thanks!! Herminie

## 2014-08-07 LAB — CA 125: CA 125: 40.2 U/mL — AB (ref 0.0–34.0)

## 2014-08-09 ENCOUNTER — Telehealth: Payer: Self-pay | Admitting: *Deleted

## 2014-08-09 NOTE — Telephone Encounter (Signed)
Pt requesting results of CA 125. Pt informed Belva Bertin will be contacting her with results.

## 2014-08-09 NOTE — Telephone Encounter (Signed)
Pt aware of labs, to make appt with Dr Elonda Husky to talk options

## 2014-08-17 ENCOUNTER — Encounter: Payer: Self-pay | Admitting: Obstetrics & Gynecology

## 2014-08-17 ENCOUNTER — Encounter: Payer: Medicaid Other | Admitting: Obstetrics & Gynecology

## 2014-08-18 ENCOUNTER — Encounter: Payer: Self-pay | Admitting: Obstetrics & Gynecology

## 2014-08-18 ENCOUNTER — Ambulatory Visit (INDEPENDENT_AMBULATORY_CARE_PROVIDER_SITE_OTHER): Payer: Medicaid Other | Admitting: Obstetrics & Gynecology

## 2014-08-18 VITALS — BP 120/80 | HR 80 | Ht 66.0 in | Wt 162.0 lb

## 2014-08-18 DIAGNOSIS — N8320 Unspecified ovarian cysts: Secondary | ICD-10-CM | POA: Diagnosis not present

## 2014-08-18 DIAGNOSIS — N83201 Unspecified ovarian cyst, right side: Secondary | ICD-10-CM

## 2014-08-18 MED ORDER — ONDANSETRON HCL 8 MG PO TABS: 8.0000 mg | ORAL_TABLET | Freq: Three times a day (TID) | ORAL | Status: DC | PRN

## 2014-08-18 MED ORDER — HYDROCODONE-ACETAMINOPHEN 5-325 MG PO TABS: 1.0000 | ORAL_TABLET | Freq: Four times a day (QID) | ORAL | Status: DC | PRN

## 2014-08-18 MED ORDER — KETOROLAC TROMETHAMINE 10 MG PO TABS: 10.0000 mg | ORAL_TABLET | Freq: Three times a day (TID) | ORAL | Status: DC | PRN

## 2014-08-18 NOTE — Progress Notes (Signed)
[name], female   DOB: Sep 15, 1990, 24 y.o.   MRN: [id number] Preoperative History and Physical  Kayla Nicholson is a 24 y.o. G1P0101 with Patient's last menstrual period was 07/12/2014. admitted for a laparoscopic right ovarian cystectomy possible oophorectomy for a 13 cm complex right ovarian cyst.    PMH:    Past Medical History  Diagnosis Date  . Back pain   . BV (bacterial vaginosis) 12/22/2013  . Contraceptive management 02/18/2014  . Yeast infection 04/27/2014  . Urinary frequency 07/27/2014  . Irregular bleeding 07/27/2014  . LLQ discomfort 07/27/2014    PSH:    History reviewed. No pertinent past surgical history.  POb/GynH:      OB History    Gravida Para Term Preterm AB TAB SAB Ectopic Multiple Living   1 1  1      1       SH:   History  Substance Use Topics  . Smoking status: Never Smoker   . Smokeless tobacco: Never Used  . Alcohol Use: Yes     Comment: occassional    FH:    Family History  Problem Relation Age of Onset  . Hypertension Mother   . Ovarian cysts Sister   . Hypertension Maternal Grandmother   . Diabetes Maternal Grandmother   . Ovarian cysts Maternal Grandmother   . Hernia Daughter      Allergies:  Allergies  Allergen Reactions  . Bee Venom Shortness Of Breath and Swelling  . Macrobid [Nitrofurantoin Monohyd Macro] Nausea Only    Medications:       Current outpatient prescriptions:  .  medroxyPROGESTERone (DEPO-PROVERA) 150 MG/ML injection, Inject 1 mL (150 mg total) into the muscle once., Disp: 1 mL, Rfl: 4  Review of Systems:   Review of Systems  Constitutional: Negative for fever, chills, weight loss, malaise/fatigue and diaphoresis.  HENT: Negative for hearing loss, ear pain, nosebleeds, congestion, sore throat, neck pain, tinnitus and ear discharge.   Eyes: Negative for blurred vision, double vision, photophobia, pain, discharge and redness.  Respiratory: Negative for cough, hemoptysis, sputum production, shortness  of breath, wheezing and stridor.   Cardiovascular: Negative for chest pain, palpitations, orthopnea, claudication, leg swelling and PND.  Gastrointestinal: Positive for abdominal pain. Negative for heartburn, nausea, vomiting, diarrhea, constipation, blood in stool and melena.  Genitourinary: Negative for dysuria, urgency, frequency, hematuria and flank pain.  Musculoskeletal: Negative for myalgias, back pain, joint pain and falls.  Skin: Negative for itching and rash.  Neurological: Negative for dizziness, tingling, tremors, sensory change, speech change, focal weakness, seizures, loss of consciousness, weakness and headaches.  Endo/Heme/Allergies: Negative for environmental allergies and polydipsia. Does not bruise/bleed easily.  Psychiatric/Behavioral: Negative for depression, suicidal ideas, hallucinations, memory loss and substance abuse. The patient is not nervous/anxious and does not have insomnia.      PHYSICAL EXAM:  Blood pressure 120/80, pulse 80, height 5\' 6"  (1.676 m), weight 162 lb (73.483 kg), last menstrual period 07/12/2014.    Vitals reviewed. Constitutional: She is oriented to person, place, and time. She appears well-developed and well-nourished.  HENT:  Head: Normocephalic and atraumatic.  Right Ear: External ear normal.  Left Ear: External ear normal.  Nose: Nose normal.  Mouth/Throat: Oropharynx is clear and moist.  Eyes: Conjunctivae and EOM are normal. Pupils are equal, round, and reactive to light. Right eye exhibits no discharge. Left eye exhibits no discharge. No scleral icterus.  Neck: Normal range of motion. Neck supple. No tracheal deviation present. No  thyromegaly present.  Cardiovascular: Normal rate, regular rhythm, normal heart sounds and intact distal pulses.  Exam reveals no gallop and no friction rub.   No murmur heard. Respiratory: Effort normal and breath sounds normal. No respiratory distress. She has no wheezes. She has no rales. She exhibits no  tenderness.  GI: Soft. Bowel sounds are normal. She exhibits no distension and no mass. There is tenderness. There is no rebound and no guarding.  Genitourinary:       Vulva is normal without lesions Vagina is pink moist without discharge Cervix normal in appearance and pap is normal Uterus is normal size, contour, position, consistency, mobility, non-tender Adnexa is left ovary normal, right ovarian cyst 13 cm, complex Musculoskeletal: Normal range of motion. She exhibits no edema and no tenderness.  Neurological: She is alert and oriented to person, place, and time. She has normal reflexes. She displays normal reflexes. No cranial nerve deficit. She exhibits normal muscle tone. Coordination normal.  Skin: Skin is warm and dry. No rash noted. No erythema. No pallor.  Psychiatric: She has a normal mood and affect. Her behavior is normal. Judgment and thought content normal.    Labs: Results for orders placed or performed in visit on 08/05/14 (from the past 336 hour(s))  CA 125   Collection Time: 08/06/14 10:09 AM  Result Value Ref Range   CA 125 40.2 (H) 0.0 - 34.0 U/mL    EKG: No orders found for this or any previous visit.  Imaging Studies: US Transvaginal Non-ob  2014-08-14   GYNECOLOGIC SONOGRAM   Kayla Nicholson is a 24 y.o. G1P0101 LMP 07/12/2014 for a pelvic sonogram  for LLQ pain.  Uterus                      8.2 x 4.3 x 4.8 cm, wnl  Endometrium          5.0 mm, symmetrical , wnl  Right ovary             13.3 x 10.4 x 9.4 cm,  Complex RT ov mass w cystic  and solid components,no separate ov tissue seen,art and venous flow was  noted  Left ovary                3.2  x 2.2 x 2.7 cm, wnl    Technician Comments:  US PELVIS TV and TA; normal anteverted uterus,EEC 5.45mm,normal lt ov, rt  adnexal mass w/cystic and solid components 13.3 x 10.4 x 9.4cm,no separate  ovarian tissue seen,art and venous flow was noted,Pt did not complain of  any pain during ultrasound.   Amber Heide Guile 08-14-2014  2:15 PM  Clinical Impression and recommendations:  I have reviewed the sonogram results above.  Combined with the patient's current clinical course, below are my  impressions and any appropriate recommendations for management based on  the sonographic findings:  1. Large cystic mass making up the right ovary, with internal cystic and  tissue components. This could represent a large internal hemorrhage with  consolidation of clot, or ovarian neoplasm. 2. Ca 125 advised , along with short interval followup exam by provider.  FERGUSON,JOHN V  US Pelvis Complete  2014/08/14   GYNECOLOGIC SONOGRAM   Kayla Nicholson is a 24 y.o. G1P0101 LMP 07/12/2014 for a pelvic sonogram  for LLQ pain.  Uterus                      8.2 x  4.3 x 4.8 cm, wnl  Endometrium          5.0 mm, symmetrical , wnl  Right ovary             13.3 x 10.4 x 9.4 cm,  Complex RT ov mass w cystic  and solid components,no separate ov tissue seen,art and venous flow was  noted  Left ovary                3.2  x 2.2 x 2.7 cm, wnl    Technician Comments:  US PELVIS TV and TA; normal anteverted uterus,EEC 5.66mm,normal lt ov, rt  adnexal mass w/cystic and solid components 13.3 x 10.4 x 9.4cm,no separate  ovarian tissue seen,art and venous flow was noted,Pt did not complain of  any pain during ultrasound.   Amber Heide Guile 08/04/2014 2:15 PM  Clinical Impression and recommendations:  I have reviewed the sonogram results above.  Combined with the patient's current clinical course, below are my  impressions and any appropriate recommendations for management based on  the sonographic findings:  1. Large cystic mass making up the right ovary, with internal cystic and  tissue components. This could represent a large internal hemorrhage with  consolidation of clot, or ovarian neoplasm. 2. Ca 125 advised , along with short interval followup exam by provider.  FERGUSON,JOHN V  [place] Art/ven Flow Abd Pelv Doppler  08/04/2014   GYNECOLOGIC SONOGRAM   Kayla Nicholson is a 24  y.o. G1P0101 LMP 07/12/2014 for a pelvic sonogram  for LLQ pain.  Uterus                      8.2 x 4.3 x 4.8 cm, wnl  Endometrium          5.0 mm, symmetrical , wnl  Right ovary             13.3 x 10.4 x 9.4 cm,  Complex RT ov mass w cystic  and solid components,no separate ov tissue seen,art and venous flow was  noted  Left ovary                3.2  x 2.2 x 2.7 cm, wnl    Technician Comments:  US PELVIS TV and TA; normal anteverted uterus,EEC 5.65mm,normal lt ov, rt  adnexal mass w/cystic and solid components 13.3 x 10.4 x 9.4cm,no separate  ovarian tissue seen,art and venous flow was noted,Pt did not complain of  any pain during ultrasound.   Amber Heide Guile 08/04/2014 2:15 PM  Clinical Impression and recommendations:  I have reviewed the sonogram results above.  Combined with the patient's current clinical course, below are my  impressions and any appropriate recommendations for management based on  the sonographic findings:  1. Large cystic mass making up the right ovary, with internal cystic and  tissue components. This could represent a large internal hemorrhage with  consolidation of clot, or ovarian neoplasm. 2. Ca 125 advised , along with short interval followup exam by provider.  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 Urinary frequency 07/27/2014  . Irregular bleeding 07/27/2014  . LLQ discomfort 07/27/2014  . Yeast infection 04/27/2014  . Contraceptive management 02/18/2014  . Vaginal discharge 12/22/2013  . BV (bacterial vaginosis) 12/22/2013  . Routine gynecological examination 10/23/2012    Plan: Laparoscopic right ovarian  cystectomy Possible oophorectomy  Dorthy Hustead H 08/18/2014 2:41 PM

## 2014-08-19 ENCOUNTER — Ambulatory Visit: Payer: Medicaid Other

## 2014-08-19 ENCOUNTER — Ambulatory Visit: Payer: Medicaid Other | Admitting: Adult Health

## 2014-08-19 ENCOUNTER — Encounter: Payer: Self-pay | Admitting: Obstetrics & Gynecology

## 2014-08-19 NOTE — Patient Instructions (Addendum)
Kayla Nicholson  08/19/2014    Your procedure is scheduled on 08/25/14.  Report to [facility] at 07:00 A.M.  Call this number if you have problems the morning of surgery:  [phone]   Remember:  Do not eat food or drink liquids after midnight.  Take these medicines the morning of surgery with A SIP OF WATER:    Do not wear jewelry, make-up or nail polish.  Do not wear lotions, powders, or perfumes.  You may wear deodorant.  Do not shave 48 hours prior to surgery.  Men may shave face and neck.  Do not bring valuables to the hospital.  [facility] is not responsible for any belongings or valuables.  Contacts, dentures or bridgework may not be worn into surgery.  Leave your suitcase in the car.  After surgery it may be brought to your room.  For patients admitted to the hospital, discharge time will be determined by your treatment team.  Patients discharged the day of surgery will not be allowed to drive home.   Special instructions:  Shower using Hibiclens (CHG bath) the night before surgery and the morning of surgery.   Please read over the following fact sheets that you were given. Anesthesia Post-op Instructions and Care and Recovery After Surgery    Ovarian Cystectomy Ovarian cystectomy is surgery to remove a fluid-filled sac (cyst) on an ovary. The ovaries are small organs that produce eggs in women. Various types of cysts can form on the ovaries. Most are not cancerous. Surgery may be done if a cyst is large or is causing symptoms such as pain. It may also be done for a cyst that is or might be cancerous. This surgery can be done using a laparoscopic technique or an open abdominal technique. The laparoscopic technique involves smaller cuts (incisions) and a faster recovery time. The technique used will depend on your age, the type of cyst, and whether the cyst is cancerous. The laparoscopic technique is not used for a cancerous cyst. LET [facility] CARE PROVIDER KNOW  ABOUT:   Any allergies you have.  All medicines you are taking, including vitamins, herbs, eye drops, creams, and over-the-counter medicines.  Previous problems you or members of your family have had with the use of anesthetics.  Any blood disorders you have.  Previous surgeries you have had.  Medical conditions you have.  Any chance you might be pregnant. RISKS AND COMPLICATIONS Generally, this is a safe procedure. However, as with any procedure, complications can occur. Possible complications include:  Excessive bleeding.  Infection.  Injury to other organs.  Blood clots.  Becoming incapable of getting pregnant (infertile). BEFORE THE PROCEDURE  Ask your health care provider about changing or stopping any regular medicines. Avoid taking aspirin, ibuprofen, or blood thinners as directed by your health care provider.  Do not eat or drink anything after midnight the night before surgery.  If you smoke, do not smoke for at least 2 weeks before your surgery.  Do not drink alcohol the day before your surgery.  Let your health care provider know if you develop a cold or any infection before your surgery.  Arrange for someone to drive you home after the procedure or after your hospital stay. Also arrange for someone to help you with activities during recovery. PROCEDURE  Either a laparoscopic technique or an open abdominal technique may be used for this surgery.  Small monitors will be put on your body. They are  used to check your heart, blood pressure, and oxygen level.   An IV access tube will be put into one of your veins. Medicine will be able to flow directly into your body through this IV tube.   You might be given a medicine to help you relax (sedative).   You will be given a medicine to make you sleep (general anesthetic). A breathing tube may be placed into your lungs during the procedure. Laparoscopic Technique  Several small cuts (incisions) are made in  your abdomen. These are typically about 1 to 2 cm long.   Your abdomen will be filled with carbon dioxide gas so that it expands. This gives the surgeon more room to operate and makes your organs easier to see.   A thin, lighted tube with a tiny camera on the end (laparoscope) is put through one of the small incisions. The camera on the laparoscope sends a picture to a TV screen in the operating room. This gives the surgeon a good view inside your abdomen.   Hollow tubes are put through the other small incisions in your abdomen. The tools needed for the procedure are put through these tubes.  The ovary with the cyst is identified, and the cyst is removed. It is sent to the lab for testing. If it is cancer, both ovaries may need to be removed during a different surgery.  Tools are removed. The incisions are then closed with stitches or skin glue, and dressings may be applied. Open Abdominal Technique  A single large incision is made along your bikini line or in the middle of your lower abdomen.  The ovary with the cyst is identified, and the cyst is removed. It is sent to the lab for testing. If it is cancer, both ovaries may need to be removed during a different surgery.  The incision is then closed with stitches or staples. AFTER THE PROCEDURE   You will wake up from anesthesia and be taken to a recovery area.  If you had laparoscopic surgery, you may be able to go home the same day, or you may need to stay in the hospital overnight.  If you had open abdominal surgery, you will need to stay in the hospital for a few days.  Your IV access tube and catheter will be removed the first or second day, after you are able to eat and drink enough.  You may be given medicine to relieve pain or to help you sleep.  You may be given an antibiotic medicine if needed. Document Released: 12/31/2006 Document Revised: 12/24/2012 Document Reviewed: 10/15/2012 [facility] Patient Information 2015  [place], [place]. This information is not intended to replace advice given to you by your health care provider. Make sure you discuss any questions you have with your health care provider.    PATIENT INSTRUCTIONS POST-ANESTHESIA  IMMEDIATELY FOLLOWING SURGERY:  Do not drive or operate machinery for the first twenty four hours after surgery.  Do not make any important decisions for twenty four hours after surgery or while taking narcotic pain medications or sedatives.  If you develop intractable nausea and vomiting or a severe headache please notify your doctor immediately.  FOLLOW-UP:  Please make an appointment with your surgeon as instructed. You do not need to follow up with anesthesia unless specifically instructed to do so.  WOUND CARE INSTRUCTIONS (if applicable):  Keep a dry clean dressing on the anesthesia/puncture wound site if there is drainage.  Once the wound has quit draining you  may leave it open to air.  Generally you should leave the bandage intact for twenty four hours unless there is drainage.  If the epidural site drains for more than 36-48 hours please call the anesthesia department.  QUESTIONS?:  Please feel free to call your physician or the hospital operator if you have any questions, and they will be happy to assist you.

## 2014-08-20 ENCOUNTER — Encounter (HOSPITAL_COMMUNITY)
Admission: RE | Admit: 2014-08-20 | Discharge: 2014-08-20 | Disposition: A | Discharge status: Home / Self Care | Payer: Medicaid Other | Source: Ambulatory Visit | Attending: Obstetrics & Gynecology | Admitting: Obstetrics & Gynecology

## 2014-08-20 ENCOUNTER — Encounter (HOSPITAL_COMMUNITY): Payer: Self-pay

## 2014-08-20 DIAGNOSIS — N8329 Other ovarian cysts: Secondary | ICD-10-CM | POA: Insufficient documentation

## 2014-08-20 DIAGNOSIS — Z01818 Encounter for other preprocedural examination: Secondary | ICD-10-CM | POA: Diagnosis present

## 2014-08-20 LAB — COMPREHENSIVE METABOLIC PANEL
ALBUMIN: 3.8 g/dL (ref 3.5–5.0)
ALK PHOS: 32 U/L — AB (ref 38–126)
ALT: 15 U/L (ref 14–54)
ANION GAP: 2 — AB (ref 5–15)
AST: 18 U/L (ref 15–41)
BUN: 16 mg/dL (ref 6–20)
CALCIUM: 8.8 mg/dL — AB (ref 8.9–10.3)
CHLORIDE: 108 mmol/L (ref 101–111)
CO2: 25 mmol/L (ref 22–32)
CREATININE: 0.67 mg/dL (ref 0.44–1.00)
GFR calc Af Amer: >60 mL/min (ref >60)
GFR calc non Af Amer: >60 mL/min (ref >60)
Glucose, Bld: 90 mg/dL (ref 65–99)
Potassium: 4.3 mmol/L (ref 3.5–5.1)
Sodium: 135 mmol/L (ref 135–145)
TOTAL PROTEIN: 6.6 g/dL (ref 6.5–8.1)
Total Bilirubin: 0.7 mg/dL (ref 0.3–1.2)

## 2014-08-20 LAB — URINALYSIS, ROUTINE W REFLEX MICROSCOPIC
Bilirubin Urine: NEGATIVE
GLUCOSE, UA: NEGATIVE mg/dL
Ketones, ur: NEGATIVE mg/dL
Nitrite: NEGATIVE
Protein, ur: NEGATIVE mg/dL
UROBILINOGEN UA: 0.2 mg/dL (ref 0.0–1.0)
pH: 5.5 (ref 5.0–8.0)

## 2014-08-20 LAB — CBC
HCT: 38.5 % (ref 36.0–46.0)
Hemoglobin: 12.9 g/dL (ref 12.0–15.0)
MCH: 30.4 pg (ref 26.0–34.0)
MCHC: 33.5 g/dL (ref 30.0–36.0)
MCV: 90.8 fL (ref 78.0–100.0)
PLATELETS: 208 10*3/uL (ref 150–400)
RBC: 4.24 MIL/uL (ref 3.87–5.11)
RDW: 12.3 % (ref 11.5–15.5)
WBC: 7.9 10*3/uL (ref 4.0–10.5)

## 2014-08-20 LAB — URINE MICROSCOPIC-ADD ON

## 2014-08-20 LAB — HCG, QUANTITATIVE, PREGNANCY: hCG, Beta Chain, Quant, S: <1 m[IU]/mL (ref <5)

## 2014-08-22 LAB — TYPE AND SCREEN
ABO/RH(D): A POS
Antibody Screen: NEGATIVE

## 2014-08-25 ENCOUNTER — Encounter (HOSPITAL_COMMUNITY)
Admission: RE | Disposition: A | Discharge status: Home / Self Care | Payer: Self-pay | Source: Ambulatory Visit | Attending: Obstetrics & Gynecology

## 2014-08-25 ENCOUNTER — Ambulatory Visit (HOSPITAL_COMMUNITY): Payer: Medicaid Other | Admitting: Anesthesiology

## 2014-08-25 ENCOUNTER — Encounter (HOSPITAL_COMMUNITY): Payer: Self-pay | Admitting: *Deleted

## 2014-08-25 ENCOUNTER — Ambulatory Visit (HOSPITAL_COMMUNITY)
Admission: RE | Admit: 2014-08-25 | Discharge: 2014-08-25 | Disposition: A | Discharge status: Home / Self Care | Payer: Medicaid Other | Source: Ambulatory Visit | Attending: Obstetrics & Gynecology | Admitting: Obstetrics & Gynecology

## 2014-08-25 ENCOUNTER — Ambulatory Visit: Payer: Medicaid Other | Admitting: Adult Health

## 2014-08-25 DIAGNOSIS — N8329 Other ovarian cysts: Secondary | ICD-10-CM | POA: Insufficient documentation

## 2014-08-25 DIAGNOSIS — K219 Gastro-esophageal reflux disease without esophagitis: Secondary | ICD-10-CM | POA: Diagnosis not present

## 2014-08-25 DIAGNOSIS — Z793 Long term (current) use of hormonal contraceptives: Secondary | ICD-10-CM | POA: Diagnosis not present

## 2014-08-25 DIAGNOSIS — Z842 Family history of other diseases of the genitourinary system: Secondary | ICD-10-CM | POA: Insufficient documentation

## 2014-08-25 DIAGNOSIS — D270 Benign neoplasm of right ovary: Secondary | ICD-10-CM | POA: Insufficient documentation

## 2014-08-25 DIAGNOSIS — N838 Other noninflammatory disorders of ovary, fallopian tube and broad ligament: Secondary | ICD-10-CM | POA: Diagnosis present

## 2014-08-25 HISTORY — PX: LAPAROSCOPIC OVARIAN CYSTECTOMY: SHX6248

## 2014-08-25 SURGERY — EXCISION, CYST, OVARY, LAPAROSCOPIC
Anesthesia: General | Site: Abdomen | Laterality: Right

## 2014-08-25 MED ORDER — LIDOCAINE HCL (PF) 1 % IJ SOLN
INTRAMUSCULAR | Status: AC
Start: 2014-08-25 — End: 2014-08-25
  Filled 2014-08-25: qty 10

## 2014-08-25 MED ORDER — KETOROLAC TROMETHAMINE 30 MG/ML IJ SOLN
30.0000 mg | Freq: Once | INTRAMUSCULAR | Status: AC
  Administered 2014-08-25: 30 mg via INTRAVENOUS

## 2014-08-25 MED ORDER — KETOROLAC TROMETHAMINE 30 MG/ML IJ SOLN
INTRAMUSCULAR | Status: AC
  Filled 2014-08-25: qty 1

## 2014-08-25 MED ORDER — SODIUM CHLORIDE 0.9 % IR SOLN
Status: DC | PRN
  Administered 2014-08-25 (×2): 3000 mL

## 2014-08-25 MED ORDER — MIDAZOLAM HCL 2 MG/2ML IJ SOLN
INTRAMUSCULAR | Status: AC
  Filled 2014-08-25: qty 2

## 2014-08-25 MED ORDER — FENTANYL CITRATE (PF) 250 MCG/5ML IJ SOLN
INTRAMUSCULAR | Status: AC
  Filled 2014-08-25: qty 5

## 2014-08-25 MED ORDER — CEFAZOLIN SODIUM-DEXTROSE 2-3 GM-% IV SOLR
INTRAVENOUS | Status: AC
  Filled 2014-08-25: qty 50

## 2014-08-25 MED ORDER — PROPOFOL 10 MG/ML IV BOLUS
INTRAVENOUS | Status: DC | PRN
  Administered 2014-08-25: 30 mg via INTRAVENOUS
  Administered 2014-08-25: 150 mg via INTRAVENOUS
  Administered 2014-08-25: 20 mg via INTRAVENOUS

## 2014-08-25 MED ORDER — SODIUM CHLORIDE 0.9 % IJ SOLN
INTRAMUSCULAR | Status: AC
  Filled 2014-08-25: qty 3

## 2014-08-25 MED ORDER — GLYCOPYRROLATE 0.2 MG/ML IJ SOLN
INTRAMUSCULAR | Status: DC | PRN
  Administered 2014-08-25: 0.4 mg via INTRAVENOUS

## 2014-08-25 MED ORDER — LACTATED RINGERS IV SOLN
INTRAVENOUS | Status: DC
  Administered 2014-08-25: 10:00:00 via INTRAVENOUS
  Administered 2014-08-25: 1000 mL via INTRAVENOUS

## 2014-08-25 MED ORDER — FENTANYL CITRATE (PF) 100 MCG/2ML IJ SOLN
25.0000 ug | INTRAMUSCULAR | Status: DC | PRN
  Administered 2014-08-25: 25 ug via INTRAVENOUS
  Filled 2014-08-25: qty 2

## 2014-08-25 MED ORDER — FENTANYL CITRATE (PF) 100 MCG/2ML IJ SOLN
INTRAMUSCULAR | Status: DC | PRN
  Administered 2014-08-25: 25 ug via INTRAVENOUS
  Administered 2014-08-25: 75 ug via INTRAVENOUS
  Administered 2014-08-25 (×2): 50 ug via INTRAVENOUS
  Administered 2014-08-25: 25 ug via INTRAVENOUS
  Administered 2014-08-25 (×2): 50 ug via INTRAVENOUS
  Administered 2014-08-25: 25 ug via INTRAVENOUS

## 2014-08-25 MED ORDER — BUPIVACAINE LIPOSOME 1.3 % IJ SUSP
INTRAMUSCULAR | Status: AC
  Filled 2014-08-25: qty 20

## 2014-08-25 MED ORDER — FENTANYL CITRATE (PF) 100 MCG/2ML IJ SOLN
INTRAMUSCULAR | Status: AC
  Filled 2014-08-25: qty 2

## 2014-08-25 MED ORDER — CEFAZOLIN SODIUM-DEXTROSE 2-3 GM-% IV SOLR
2.0000 g | INTRAVENOUS | Status: AC
  Administered 2014-08-25: 2 g via INTRAVENOUS

## 2014-08-25 MED ORDER — ONDANSETRON HCL 4 MG/2ML IJ SOLN
INTRAMUSCULAR | Status: AC
  Filled 2014-08-25: qty 2

## 2014-08-25 MED ORDER — BUPIVACAINE LIPOSOME 1.3 % IJ SUSP
INTRAMUSCULAR | Status: DC | PRN
  Administered 2014-08-25: 20 mL

## 2014-08-25 MED ORDER — PROMETHAZINE HCL 25 MG/ML IJ SOLN
INTRAMUSCULAR | Status: AC
  Filled 2014-08-25: qty 1

## 2014-08-25 MED ORDER — MIDAZOLAM HCL 2 MG/2ML IJ SOLN
1.0000 mg | INTRAMUSCULAR | Status: DC | PRN
Start: 2014-08-25 — End: 2014-08-25
  Administered 2014-08-25 (×2): 2 mg via INTRAVENOUS
  Filled 2014-08-25: qty 2

## 2014-08-25 MED ORDER — PROMETHAZINE HCL 25 MG/ML IJ SOLN
6.2500 mg | Freq: Once | INTRAMUSCULAR | Status: AC
  Administered 2014-08-25: 6.25 mg via INTRAVENOUS

## 2014-08-25 MED ORDER — ONDANSETRON HCL 4 MG/2ML IJ SOLN
4.0000 mg | Freq: Once | INTRAMUSCULAR | Status: AC
  Administered 2014-08-25: 4 mg via INTRAVENOUS

## 2014-08-25 MED ORDER — ONDANSETRON HCL 4 MG/2ML IJ SOLN
4.0000 mg | Freq: Once | INTRAMUSCULAR | Status: AC | PRN
  Administered 2014-08-25: 4 mg via INTRAVENOUS

## 2014-08-25 MED ORDER — NEOSTIGMINE METHYLSULFATE 10 MG/10ML IV SOLN
INTRAVENOUS | Status: DC | PRN
  Administered 2014-08-25: 2 mg via INTRAVENOUS

## 2014-08-25 MED ORDER — NEOSTIGMINE METHYLSULFATE 10 MG/10ML IV SOLN
INTRAVENOUS | Status: AC
  Filled 2014-08-25: qty 1

## 2014-08-25 MED ORDER — ROCURONIUM BROMIDE 100 MG/10ML IV SOLN
INTRAVENOUS | Status: DC | PRN
  Administered 2014-08-25: 10 mg via INTRAVENOUS
  Administered 2014-08-25: 40 mg via INTRAVENOUS

## 2014-08-25 MED ORDER — DEXAMETHASONE SODIUM PHOSPHATE 4 MG/ML IJ SOLN
4.0000 mg | Freq: Once | INTRAMUSCULAR | Status: AC
  Administered 2014-08-25: 4 mg via INTRAVENOUS

## 2014-08-25 MED ORDER — 0.9 % SODIUM CHLORIDE (POUR BTL) OPTIME
TOPICAL | Status: DC | PRN
  Administered 2014-08-25: 500 mL

## 2014-08-25 MED ORDER — PROPOFOL 10 MG/ML IV BOLUS
INTRAVENOUS | Status: AC
  Filled 2014-08-25: qty 20

## 2014-08-25 MED ORDER — ONDANSETRON HCL 4 MG/2ML IJ SOLN
INTRAMUSCULAR | Status: DC | PRN
  Administered 2014-08-25: 4 mg via INTRAVENOUS

## 2014-08-25 MED ORDER — GLYCOPYRROLATE 0.2 MG/ML IJ SOLN
INTRAMUSCULAR | Status: AC
  Filled 2014-08-25: qty 1

## 2014-08-25 MED ORDER — DEXAMETHASONE SODIUM PHOSPHATE 4 MG/ML IJ SOLN
INTRAMUSCULAR | Status: AC
  Filled 2014-08-25: qty 1

## 2014-08-25 MED ORDER — LIDOCAINE HCL (CARDIAC) 10 MG/ML IV SOLN
INTRAVENOUS | Status: DC | PRN
  Administered 2014-08-25: 50 mg via INTRAVENOUS

## 2014-08-25 SURGICAL SUPPLY — 52 items
APPLIER CLIP 5 13 M/L LIGAMAX5 (MISCELLANEOUS)
APPLIER CLIP ROT 10 11.4 M/L (STAPLE)
APPLIER CLIP UNV 5X34 EPIX (ENDOMECHANICALS) IMPLANT
BAG HAMPER (MISCELLANEOUS) ×4 IMPLANT
BLADE SURG SZ11 CARB STEEL (BLADE) ×4 IMPLANT
CLIP APPLIE 5 13 M/L LIGAMAX5 (MISCELLANEOUS) IMPLANT
CLIP APPLIE ROT 10 11.4 M/L (STAPLE) IMPLANT
CLOTH BEACON ORANGE TIMEOUT ST (SAFETY) ×4 IMPLANT
COVER LIGHT HANDLE STERIS (MISCELLANEOUS) ×8 IMPLANT
ELECT REM PT RETURN 9FT ADLT (ELECTROSURGICAL) ×4
ELECTRODE REM PT RTRN 9FT ADLT (ELECTROSURGICAL) ×2 IMPLANT
FILTER SMOKE EVAC LAPAROSHD (FILTER) ×4 IMPLANT
FORMALIN 10 PREFIL 480ML (MISCELLANEOUS) ×4 IMPLANT
GLOVE BIO SURGEON STRL SZ7.5 (GLOVE) ×4 IMPLANT
GLOVE BIOGEL PI IND STRL 8 (GLOVE) ×2 IMPLANT
GLOVE BIOGEL PI INDICATOR 8 (GLOVE) ×2
GLOVE ECLIPSE 8.0 STRL XLNG CF (GLOVE) ×4 IMPLANT
GLOVE INDICATOR 7.5 STRL GRN (GLOVE) ×8 IMPLANT
GOWN STRL REUS W/TWL LRG LVL3 (GOWN DISPOSABLE) ×8 IMPLANT
GOWN STRL REUS W/TWL XL LVL3 (GOWN DISPOSABLE) ×4 IMPLANT
INST SET LAPROSCOPIC GYN AP (KITS) ×4 IMPLANT
IV NS IRRIG 3000ML ARTHROMATIC (IV SOLUTION) ×8 IMPLANT
KIT ROOM TURNOVER APOR (KITS) ×4 IMPLANT
MANIFOLD NEPTUNE II (INSTRUMENTS) ×4 IMPLANT
NEEDLE HYPO 18GX1.5 BLUNT FILL (NEEDLE) ×4 IMPLANT
NEEDLE HYPO 25X1 1.5 SAFETY (NEEDLE) ×4 IMPLANT
NEEDLE INSUFFLATION 120MM (ENDOMECHANICALS) ×4 IMPLANT
NS IRRIG 1000ML POUR BTL (IV SOLUTION) ×4 IMPLANT
PACK PERI GYN (CUSTOM PROCEDURE TRAY) ×4 IMPLANT
PAD ARMBOARD 7.5X6 YLW CONV (MISCELLANEOUS) ×4 IMPLANT
POUCH SPECIMEN RETRIEVAL 10MM (ENDOMECHANICALS) ×4 IMPLANT
SCALPEL HARMONIC ACE (MISCELLANEOUS) ×4 IMPLANT
SET BASIN LINEN APH (SET/KITS/TRAYS/PACK) ×4 IMPLANT
SET TUBE IRRIG SUCTION NO TIP (IRRIGATION / IRRIGATOR) ×4 IMPLANT
SLEEVE ENDOPATH XCEL 5M (ENDOMECHANICALS) IMPLANT
SOLUTION ANTI FOG 6CC (MISCELLANEOUS) ×4 IMPLANT
SPONGE GAUZE 2X2 8PLY STER LF (GAUZE/BANDAGES/DRESSINGS) ×3
SPONGE GAUZE 2X2 8PLY STRL LF (GAUZE/BANDAGES/DRESSINGS) ×9 IMPLANT
STAPLER VISISTAT 35W (STAPLE) ×4 IMPLANT
SUT VICRYL 0 UR6 27IN ABS (SUTURE) ×4 IMPLANT
SYR 20CC LL (SYRINGE) ×4 IMPLANT
SYRINGE 10CC LL (SYRINGE) ×4 IMPLANT
TAPE CLOTH SURG 4X10 WHT LF (GAUZE/BANDAGES/DRESSINGS) ×4 IMPLANT
TRAY FOLEY CATH SILVER 16FR (SET/KITS/TRAYS/PACK) ×4 IMPLANT
TROCAR ENDO BLADELESS 11MM (ENDOMECHANICALS) ×4 IMPLANT
TROCAR XCEL NON-BLD 11X100MML (ENDOMECHANICALS) ×4 IMPLANT
TROCAR XCEL NON-BLD 5MMX100MML (ENDOMECHANICALS) ×4 IMPLANT
TROCAR XCEL OPT SLVE 5M 100M (ENDOMECHANICALS) ×4 IMPLANT
TROCAR XCEL UNIV SLVE 11M 100M (ENDOMECHANICALS) IMPLANT
TUBING INSUF HEATED (TUBING) ×4 IMPLANT
TUBING INSUFFLATION (TUBING) ×4 IMPLANT
WARMER LAPAROSCOPE (MISCELLANEOUS) ×4 IMPLANT

## 2014-08-25 NOTE — Discharge Instructions (Signed)

## 2014-08-25 NOTE — Transfer of Care (Signed)
Immediate Anesthesia Transfer of Care Note  Patient: Kayla Nicholson  Procedure(s) Performed: Procedure(s): LAPAROSCOPIC OVARIAN CYSTECTOMY (Right)  Patient Location: PACU  Anesthesia Type:General  Level of Consciousness: awake and alert   Airway & Oxygen Therapy: Patient Spontanous Breathing and non-rebreather face mask  Post-op Assessment: Report given to RN, Post -op Vital signs reviewed and stable and Patient moving all extremities  Post vital signs: Reviewed and stable    Complications: No apparent anesthesia complications

## 2014-08-25 NOTE — Anesthesia Postprocedure Evaluation (Signed)
  Anesthesia Post-op Note  Patient: Kayla Nicholson  Procedure(s) Performed: Procedure(s): LAPAROSCOPIC OVARIAN CYSTECTOMY (Right)  Patient Location: Short Stay  Anesthesia Type:General  Level of Consciousness: awake, alert  and patient cooperative  Airway and Oxygen Therapy: Patient Spontanous Breathing  Post-op Pain: mild  Post-op Assessment: Post-op Vital signs reviewed, Patient's Cardiovascular Status Stable, Respiratory Function Stable, Patent Airway and No signs of Nausea or vomiting              Post-op Vital Signs: Reviewed and stable  Last Vitals:  Filed Vitals:   08/25/14 1348  BP: 110/55  Pulse: 95  Temp:   Resp: 16    Complications: No apparent anesthesia complications

## 2014-08-25 NOTE — H&P (Signed)
Preoperative History and Physical  Kayla Nicholson is a 24 y.o. G1P0101 with Patient's last menstrual period was 07/12/2014. admitted for a laparoscopic right ovarian cystectomy possible oophorectomy for a 13 cm complex right ovarian cyst.    PMH:  Past Medical History  Diagnosis Date  . Back pain   . BV (bacterial vaginosis) 12/22/2013  . Contraceptive management 02/18/2014  . Yeast infection 04/27/2014  . Urinary frequency 07/27/2014  . Irregular bleeding 07/27/2014  . LLQ discomfort 07/27/2014    PSH: History reviewed. No pertinent past surgical history.  POb/GynH:  OB History    Gravida Para Term Preterm AB TAB SAB Ectopic Multiple Living   1 1  1      1       SH:  History  Substance Use Topics  . Smoking status: Never Smoker   . Smokeless tobacco: Never Used  . Alcohol Use: Yes     Comment: occassional    FH:  Family History  Problem Relation Age of Onset  . Hypertension Mother   . Ovarian cysts Sister   . Hypertension Maternal Grandmother   . Diabetes Maternal Grandmother   . Ovarian cysts Maternal Grandmother   . Hernia Daughter      Allergies:  Allergies  Allergen Reactions  . Bee Venom Shortness Of Breath and Swelling  . Macrobid [Nitrofurantoin Monohyd Macro] Nausea Only    Medications:  Current outpatient prescriptions:  . medroxyPROGESTERone (DEPO-PROVERA) 150 MG/ML injection, Inject 1 mL (150 mg total) into the muscle once., Disp: 1 mL, Rfl: 4  Review of Systems:   Review of Systems  Constitutional: Negative for fever, chills, weight loss, malaise/fatigue and diaphoresis.  HENT: Negative for hearing loss, ear pain, nosebleeds, congestion, sore throat, neck pain, tinnitus and ear discharge.  Eyes: Negative for blurred vision, double vision, photophobia, pain, discharge and redness.  Respiratory: Negative for cough, hemoptysis,  sputum production, shortness of breath, wheezing and stridor.  Cardiovascular: Negative for chest pain, palpitations, orthopnea, claudication, leg swelling and PND.  Gastrointestinal: Positive for abdominal pain. Negative for heartburn, nausea, vomiting, diarrhea, constipation, blood in stool and melena.  Genitourinary: Negative for dysuria, urgency, frequency, hematuria and flank pain.  Musculoskeletal: Negative for myalgias, back pain, joint pain and falls.  Skin: Negative for itching and rash.  Neurological: Negative for dizziness, tingling, tremors, sensory change, speech change, focal weakness, seizures, loss of consciousness, weakness and headaches.  Endo/Heme/Allergies: Negative for environmental allergies and polydipsia. Does not bruise/bleed easily.  Psychiatric/Behavioral: Negative for depression, suicidal ideas, hallucinations, memory loss and substance abuse. The patient is not nervous/anxious and does not have insomnia.     PHYSICAL EXAM:  Blood pressure 120/80, pulse 80, height 5\' 6"  (1.676 m), weight 162 lb (73.483 kg), last menstrual period 07/12/2014.   Vitals reviewed. Constitutional: She is oriented to person, place, and time. She appears well-developed and well-nourished.  HENT:  Head: Normocephalic and atraumatic.  Right Ear: External ear normal.  Left Ear: External ear normal.  Nose: Nose normal.  Mouth/Throat: Oropharynx is clear and moist.  Eyes: Conjunctivae and EOM are normal. Pupils are equal, round, and reactive to light. Right eye exhibits no discharge. Left eye exhibits no discharge. No scleral icterus.  Neck: Normal range of motion. Neck supple. No tracheal deviation present. No thyromegaly present.  Cardiovascular: Normal rate, regular rhythm, normal heart sounds and intact distal pulses. Exam reveals no gallop and no friction rub.  No murmur heard. Respiratory: Effort normal and breath sounds normal. No respiratory distress. She  has no wheezes.  She has no rales. She exhibits no tenderness.  GI: Soft. Bowel sounds are normal. She exhibits no distension and no mass. There is tenderness. There is no rebound and no guarding.  Genitourinary:   Vulva is normal without lesions Vagina is pink moist without discharge Cervix normal in appearance and pap is normal Uterus is normal size, contour, position, consistency, mobility, non-tender Adnexa is left ovary normal, right ovarian cyst 13 cm, complex Musculoskeletal: Normal range of motion. She exhibits no edema and no tenderness.  Neurological: She is alert and oriented to person, place, and time. She has normal reflexes. She displays normal reflexes. No cranial nerve deficit. She exhibits normal muscle tone. Coordination normal.  Skin: Skin is warm and dry. No rash noted. No erythema. No pallor.  Psychiatric: She has a normal mood and affect. Her behavior is normal. Judgment and thought content normal.    Labs: Results for orders placed or performed in visit on 08/05/14 (from the past 336 hour(s))  CA 125   Collection Time: 08/06/14 10:09 AM  Result Value Ref Range   CA 125 40.2 (H) 0.0 - 34.0 U/mL    EKG: No orders found for this or any previous visit.  Imaging Studies:  Imaging Results    US Transvaginal Non-ob  08/04/2014 GYNECOLOGIC SONOGRAM Kayla Nicholson is a 24 y.o. G1P0101 LMP 07/12/2014 for a pelvic sonogram for LLQ pain. Uterus 8.2 x 4.3 x 4.8 cm, wnl Endometrium 5.0 mm, symmetrical , wnl Right ovary 13.3 x 10.4 x 9.4 cm, Complex RT ov mass w cystic and solid components,no separate ov tissue seen,art and venous flow was noted Left ovary 3.2 x 2.2 x 2.7 cm, wnl Technician Comments: US PELVIS TV and TA; normal anteverted uterus,EEC 5.42mm,normal lt ov, rt adnexal mass w/cystic and solid components 13.3 x 10.4 x 9.4cm,no separate ovarian tissue seen,art and venous flow was noted,Pt did not  complain of any pain during ultrasound. Amber Heide Guile 08/04/2014 2:15 PM Clinical Impression and recommendations: I have reviewed the sonogram results above. Combined with the patient's current clinical course, below are my impressions and any appropriate recommendations for management based on the sonographic findings: 1. Large cystic mass making up the right ovary, with internal cystic and tissue components. This could represent a large internal hemorrhage with consolidation of clot, or ovarian neoplasm. 2. Ca 125 advised , along with short interval followup exam by provider. FERGUSON,JOHN V  US Pelvis Complete  08/04/2014 GYNECOLOGIC SONOGRAM Kayla Nicholson is a 24 y.o. G1P0101 LMP 07/12/2014 for a pelvic sonogram for LLQ pain. Uterus 8.2 x 4.3 x 4.8 cm, wnl Endometrium 5.0 mm, symmetrical , wnl Right ovary 13.3 x 10.4 x 9.4 cm, Complex RT ov mass w cystic and solid components,no separate ov tissue seen,art and venous flow was noted Left ovary 3.2 x 2.2 x 2.7 cm, wnl Technician Comments: US PELVIS TV and TA; normal anteverted uterus,EEC 5.20mm,normal lt ov, rt adnexal mass w/cystic and solid components 13.3 x 10.4 x 9.4cm,no separate ovarian tissue seen,art and venous flow was noted,Pt did not complain of any pain during ultrasound. Amber Heide Guile 08/04/2014 2:15 PM Clinical Impression and recommendations: I have reviewed the sonogram results above. Combined with the patient's current clinical course, below are my impressions and any appropriate recommendations for management based on the sonographic findings: 1. Large cystic mass making up the right ovary, with internal cystic and tissue components. This could represent a large internal hemorrhage with consolidation of clot,  or ovarian neoplasm. 2. Ca 125 advised , along with short interval followup exam by provider. FERGUSON,JOHN V  [place] Art/ven Flow Abd Pelv  Doppler  08/04/2014 GYNECOLOGIC SONOGRAM Kayla Nicholson is a 24 y.o. G1P0101 LMP 07/12/2014 for a pelvic sonogram for LLQ pain. Uterus 8.2 x 4.3 x 4.8 cm, wnl Endometrium 5.0 mm, symmetrical , wnl Right ovary 13.3 x 10.4 x 9.4 cm, Complex RT ov mass w cystic and solid components,no separate ov tissue seen,art and venous flow was noted Left ovary 3.2 x 2.2 x 2.7 cm, wnl Technician Comments: US PELVIS TV and TA; normal anteverted uterus,EEC 5.55mm,normal lt ov, rt adnexal mass w/cystic and solid components 13.3 x 10.4 x 9.4cm,no separate ovarian tissue seen,art and venous flow was noted,Pt did not complain of any pain during ultrasound. Amber Heide Guile 08/04/2014 2:15 PM Clinical Impression and recommendations: I have reviewed the sonogram results above. Combined with the patient's current clinical course, below are my impressions and any appropriate recommendations for management based on the sonographic findings: 1. Large cystic mass making up the right ovary, with internal cystic and tissue components. This could represent a large internal hemorrhage with consolidation of clot, or ovarian neoplasm. 2. Ca 125 advised , along with short interval followup exam by provider. FERGUSON,JOHN V      Assessment: Complex right ovarian mass,( mucinous vs serous cystadenoma, possible endometrioma or borderline ovarian tumor) Patient Active Problem List   Diagnosis Date Noted  . Urinary frequency 07/27/2014  . Irregular bleeding 07/27/2014  . LLQ discomfort 07/27/2014  . Yeast infection 04/27/2014  . Contraceptive management 02/18/2014  . Vaginal discharge 12/22/2013  . BV (bacterial vaginosis) 12/22/2013  . Routine gynecological examination 10/23/2012    Plan: Laparoscopic right ovarian cystectomy Possible oophorectomy  Elbia Paro H 08/18/2014 2:41 PM

## 2014-08-25 NOTE — Anesthesia Procedure Notes (Signed)
Procedure Name: Intubation Date/Time: 08/25/2014 8:48 AM Performed by: Vista Deck Pre-anesthesia Checklist: Patient identified, Patient being monitored, Timeout performed, Emergency Drugs available and Suction available Patient Re-evaluated:Patient Re-evaluated prior to inductionOxygen Delivery Method: Circle System Utilized Preoxygenation: Pre-oxygenation with 100% oxygen Intubation Type: IV induction Ventilation: Mask ventilation without difficulty Laryngoscope Size: Miller and 2 Grade View: Grade I Tube type: Oral Tube size: 7.0 mm Number of attempts: 1 Airway Equipment and Method: Stylet and Oral airway Placement Confirmation: ETT inserted through vocal cords under direct vision,  positive ETCO2 and breath sounds checked- equal and bilateral Secured at: 21 cm Tube secured with: Tape Dental Injury: Teeth and Oropharynx as per pre-operative assessment

## 2014-08-25 NOTE — Interval H&P Note (Signed)
History and Physical Interval Note:  08/25/2014 8:11 AM  [name]  has presented today for surgery, with the diagnosis of right ovarian complex cyst  The various methods of treatment have been discussed with the patient and family. After consideration of risks, benefits and other options for treatment, the patient has consented to  Procedure(s): LAPAROSCOPIC OVARIAN CYSTECTOMY (Right) POSSIBLE LAPAROSCOPIC OOPHORECTOMY (Right) as a surgical intervention .  The patient's history has been reviewed, patient examined, no change in status, stable for surgery.  I have reviewed the patient's chart and labs.  Questions were answered to the patient's satisfaction.     EURE,LUTHER H

## 2014-08-25 NOTE — Anesthesia Postprocedure Evaluation (Signed)
  Anesthesia Post-op Note  Patient: Kayla Nicholson  Procedure(s) Performed: Procedure(s): LAPAROSCOPIC OVARIAN CYSTECTOMY (Right)  Patient Location: PACU  Anesthesia Type:General  Level of Consciousness: awake, alert  and patient cooperative  Airway and Oxygen Therapy: Patient Spontanous Breathing  Post-op Pain: mild  Post-op Assessment: Post-op Vital signs reviewed, Patient's Cardiovascular Status Stable, Respiratory Function Stable and NAUSEA AND VOMITING PRESENT              Post-op Vital Signs: Reviewed and stable  Last Vitals:  Filed Vitals:   08/25/14 1245  BP: 110/50  Pulse: 73  Temp:   Resp: 17    Complications: Meds given for postop nausea. Will reheck.

## 2014-08-25 NOTE — Op Note (Signed)
Preoperative diagnosis:  1.  Complex right ovarian mass,                                                                                      Postoperative diagnosis:  Benign cystic teratoma, right  Procedure:  Laparoscopic right ovarian cystecto25my  Surgeon:  Jacelyn Grip MD  Anesthesia:  Gen. Endotracheal  Findings:  Patient had a very large mass of her right ovary. Her very long time is difficult to determine if there was any normal ovary at all.  The mass was found to be a benign cystic teratoma   Intraoperatively, the left ovary simple cyst but had no benign cystic teratoma there were no other intraperitoneal abnormalities noted  Both tubes and the uterus appeared completely normal  Description of operation:  The patient was taken to the operating room and placed in the supine position where she underwent general endotracheal anesthesia.  She was placed in the low lithotomy position.  She was prepped and draped in the usual sterile fashion and a Foley catheter was placed.  An incision was made in the umbilicus and a Veres needle was placed into the peritoneal cavity with one pass without difficulty.  The peritoneal cavity was then insufflated.  An 11 mm non-bladed direct visualization trocar was then used and placed into the peritoneal cavity with direct laparoscopic visualization again without difficulty.  Incisions were then made in the left lower quadrant and right lower quadrant.  A 5 mm trocar was placed in the left lower quadrant and a 5 mm trocar was placed in the right lower quadrant.  Both were done under direct visualization without difficulty.  The right ovary was isolated.  The fallopian tube and infundibulopelvic ligament of the right ovary were draped over the ovary ovarian mass itself.  I had to flip the ovary up.  The harmonic scalpel was used and surface of the ovary was cauterized and then a plane was developed using the Harmonic scalpel and hydrodissection. Painstaking  dissection was performed which took well over an hour.  The ovarian cyst was found to be a benign cystic teratoma and it was completely removed from the ovary.  I had used harmonic scalpel to remove some of the more densely adherent areas of the cyst to the ovary itself.  Was able to save the right ovary and left it open for healing by secondary intention.  There was good hemostasis of the right ovary.    I then removed the benign cystic teratoma out of the left lower quadrant by doing extracorporeal morcellation and an Endo Catch bag.  There was some spillage of sebum into the peritoneal cavity.  Approximately 6 L of fluid was used to irrigate the peritoneum.  The patient was placed and an out of Trendelenburg and reverse Trendelenburg to insure free irrigation of all areas of the abdomen and peritoneal cavity.  All sebum and fluid was removed. I left about 500 cc of fluid into the peritoneal cavity postoperatively.    The fascia of the left lower quadrant incision was closed with a running 0 Vicryl. The subcutaneous tissue was then reapproximated with 0 Vicryl.  The skin was closed using skin staples.  The umbilical fascia was closed with a single 0 Vicryl suture.  The subcutaneous tissue was reapproximated loosely using 0 Vicryl.  The skin was closed using skin staples. The right lower quadrant incision was closed with staples.  X per L was injected into all the incision sites  The patient was awakened from anesthesia and taken to the recovery room in good stable condition with all counts being correct x3.  The estimated blood loss for the procedure is 100 cc.   The patient received cefazolin preoperatively.  She also received Toradol IV preoperatively.  She will be discharged from the recovery room time and seen next week for staple and suture removal.  Florian Buff, MD 08/25/2014 11:55 AM

## 2014-08-25 NOTE — Anesthesia Preprocedure Evaluation (Signed)
Anesthesia Evaluation  Patient identified by MRN, date of birth, ID band Patient awake    Reviewed: Allergy & Precautions, NPO status , Patient's Chart, lab work & pertinent test results  Airway Mallampati: II  TM Distance: >3 FB     Dental   Pulmonary neg pulmonary ROS,  breath sounds clear to auscultation        Cardiovascular negative cardio ROS  Rhythm:Regular Rate:Normal     Neuro/Psych    GI/Hepatic GERD-  ,  Endo/Other    Renal/GU      Musculoskeletal   Abdominal   Peds  Hematology   Anesthesia Other Findings   Reproductive/Obstetrics                             Anesthesia Physical Anesthesia Plan  ASA: II  Anesthesia Plan: General   Post-op Pain Management:    Induction: Intravenous, Rapid sequence and Cricoid pressure planned  Airway Management Planned: Oral ETT  Additional Equipment:   Intra-op Plan:   Post-operative Plan: Extubation in OR  Informed Consent: I have reviewed the patients History and Physical, chart, labs and discussed the procedure including the risks, benefits and alternatives for the proposed anesthesia with the patient or authorized representative who has indicated his/her understanding and acceptance.     Plan Discussed with:   Anesthesia Plan Comments:         Anesthesia Quick Evaluation

## 2014-08-26 ENCOUNTER — Telehealth: Payer: Self-pay | Admitting: Obstetrics & Gynecology

## 2014-08-26 NOTE — Telephone Encounter (Signed)
Pt states had surgery with Dr. Elonda Husky yesterday and c/o a lot of gas. Pt informed per Derrek Monaco, NP for pt to ambulate, push fluids. Pt verbalized understanding.

## 2014-08-27 ENCOUNTER — Encounter (HOSPITAL_COMMUNITY): Payer: Self-pay | Admitting: Obstetrics & Gynecology

## 2014-09-01 ENCOUNTER — Ambulatory Visit (INDEPENDENT_AMBULATORY_CARE_PROVIDER_SITE_OTHER): Payer: Medicaid Other | Admitting: Obstetrics & Gynecology

## 2014-09-01 ENCOUNTER — Encounter: Payer: Self-pay | Admitting: Obstetrics & Gynecology

## 2014-09-01 VITALS — BP 128/80 | HR 80 | Wt 151.4 lb

## 2014-09-01 DIAGNOSIS — Z9889 Other specified postprocedural states: Secondary | ICD-10-CM

## 2014-09-01 NOTE — Progress Notes (Signed)
[name], female   DOB: 16-Aug-1990, 24 y.o.   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  The patient's immediate postoperative recovery has been unremarkable. Since hospital discharge the patient reports right shoulder.   Current Outpatient Prescriptions: HYDROcodone-acetaminophen (NORCO/VICODIN) 5-325 MG per tablet, Take 1 tablet by mouth every 6 (six) hours as needed. (Patient not taking: Reported on 09/01/2014), Disp: 30 tablet, Rfl: 0 ketorolac (TORADOL) 10 MG tablet, Take 1 tablet (10 mg total) by mouth every 8 (eight) hours as needed. (Patient not taking: Reported on 09/01/2014), Disp: 15 tablet, Rfl: 0 medroxyPROGESTERone (DEPO-PROVERA) 150 MG/ML injection, Inject 1 mL (150 mg total) into the muscle once. (Patient not taking: Reported on 09/01/2014), Disp: 1 mL, Rfl: 4 ondansetron (ZOFRAN) 8 MG tablet, Take 1 tablet (8 mg total) by mouth every 8 (eight) hours as needed for nausea. (Patient not taking: Reported on 09/01/2014), Disp: 12 tablet, Rfl: 0  No current facility-administered medications for this visit.    Blood pressure 128/80, pulse 80, weight 151 lb 6.4 oz (68.675 kg), last menstrual period 07/12/2014.  Physical Exam: Incisions x3 clean dry intact staples removed  Diagnostic Tests:   Pathology: Benign cystic teratoma  Impression: S/p laparoscopic right ovarian cystectomy  Plan:   Follow up: prn    Florian Buff, MD

## 2014-09-02 ENCOUNTER — Telehealth: Payer: Self-pay | Admitting: Obstetrics & Gynecology

## 2014-09-07 NOTE — Telephone Encounter (Signed)
Pt c/o what feels like a knot in her abdomen, was having pain with it but not now. Pt also states she had a cyst removed by Dr. Elonda Husky on 08/25/2014. Pt offered an appt for today but was unable to come. Next available appt with Dr. Elonda Husky for 09/09/2014 at 9:30 am was scheduled.

## 2014-09-08 ENCOUNTER — Ambulatory Visit: Payer: Medicaid Other

## 2014-09-08 ENCOUNTER — Other Ambulatory Visit: Payer: Medicaid Other

## 2014-09-09 ENCOUNTER — Encounter: Payer: Self-pay | Admitting: *Deleted

## 2014-09-09 ENCOUNTER — Ambulatory Visit: Payer: Medicaid Other | Admitting: Obstetrics & Gynecology

## 2014-09-13 ENCOUNTER — Other Ambulatory Visit: Payer: Medicaid Other

## 2014-09-15 ENCOUNTER — Telehealth: Payer: Self-pay | Admitting: Obstetrics & Gynecology

## 2014-09-15 ENCOUNTER — Ambulatory Visit: Payer: Medicaid Other

## 2014-09-15 ENCOUNTER — Telehealth: Payer: Self-pay | Admitting: Adult Health

## 2014-09-15 MED ORDER — DESOGESTREL-ETHINYL ESTRADIOL 0.15-0.02/0.01 MG (21/5) PO TABS: 1.0000 | ORAL_TABLET | Freq: Every day | ORAL | Status: DC

## 2014-09-15 NOTE — Telephone Encounter (Signed)
Spoke with pt. Pt recently had a cyst removed from ovary from Dr. Elonda Husky. She was on Depo, but pt states shot was thrown away and she can't get it filled x 3 months. Pt states she had an appt to get a quant this am and shot this afternoon, but cancelled since she don't have her shot. Pt is interested in taking birth control pills until she can get her shot again. Please advise. Thanks!! [place]

## 2014-09-15 NOTE — Telephone Encounter (Signed)
Left message letting pt know rx was sent to pharmacy. [place]

## 2014-09-29 ENCOUNTER — Ambulatory Visit: Payer: Medicaid Other | Admitting: Adult Health

## 2014-10-04 ENCOUNTER — Ambulatory Visit: Payer: Medicaid Other | Admitting: Adult Health

## 2014-10-04 ENCOUNTER — Encounter: Payer: Self-pay | Admitting: Adult Health

## 2014-10-13 ENCOUNTER — Other Ambulatory Visit: Payer: Medicaid Other

## 2014-10-13 ENCOUNTER — Ambulatory Visit: Payer: Medicaid Other | Admitting: Obstetrics & Gynecology

## 2014-10-15 ENCOUNTER — Telehealth: Payer: Self-pay | Admitting: *Deleted

## 2014-10-15 NOTE — Telephone Encounter (Signed)
Pt called stating she needs a note excusing her from [place] test due to surgery on 08/25/2014. Per Dr. Elonda Husky ok to give pt the note. Note left at front desk for pick up.

## 2014-10-19 ENCOUNTER — Other Ambulatory Visit: Payer: Medicaid Other

## 2014-10-19 ENCOUNTER — Telehealth: Payer: Self-pay | Admitting: Women's Health

## 2014-10-19 ENCOUNTER — Ambulatory Visit: Payer: Medicaid Other | Admitting: Women's Health

## 2014-10-19 LAB — BETA HCG QUANT (REF LAB): hCG Quant: <1 m[IU]/mL

## 2014-10-19 NOTE — Telephone Encounter (Signed)
Pt informed that blood work is not back yet.  Pt is waiting to pick up her medication until lab results are in, she is scheduled for injection at 2:45 this afternoon.

## 2014-11-02 ENCOUNTER — Telehealth: Payer: Self-pay | Admitting: *Deleted

## 2014-11-02 NOTE — Telephone Encounter (Signed)
[facility], [place] faxed form stating pt needed RX for birth control. Per Marlana Latus was e-scribed on 09/15/2014, Pharmacist states did not have on file.   Verbal order for Kariva 0.15-0.02/0.01 mg 21/5 tablet, 1 package, 11 refills per Dr.Eure.

## 2015-01-20 ENCOUNTER — Telehealth: Payer: Self-pay | Admitting: Obstetrics & Gynecology

## 2015-01-20 NOTE — Telephone Encounter (Signed)
Go ahead and give her the note

## 2015-01-20 NOTE — Telephone Encounter (Signed)
Pt states she is in the [employer] and is requesting a note stating she will not be able to participate in exercises until after her appt with Dr. Elonda Husky on 01/25/2015. Pt states has a history of ovarian cyst. Please advise.

## 2015-01-20 NOTE — Telephone Encounter (Signed)
Pt informed note completed in Epic.

## 2015-01-25 ENCOUNTER — Ambulatory Visit: Payer: Medicaid Other | Admitting: Obstetrics & Gynecology

## 2015-02-23 IMAGING — US US BREAST LTD UNI LEFT INC AXILLA
1 series · 4 of 4 positions shown · non-contrast
Comparison: Multiple priors

CLINICAL DATA: Followup probably benign mass in the left breast.
This was initially discovered in 0455. The patient did not follow-up
until today. She states that this mass has decreased in size since
0455 on self-examination.

EXAM:
ULTRASOUND OF THE LEFT BREAST LIMITED

[Series 1: us breast ltd uni left inc axilla · 0.06mm/px · 4 of 4 slices shown]
[im 1/4]
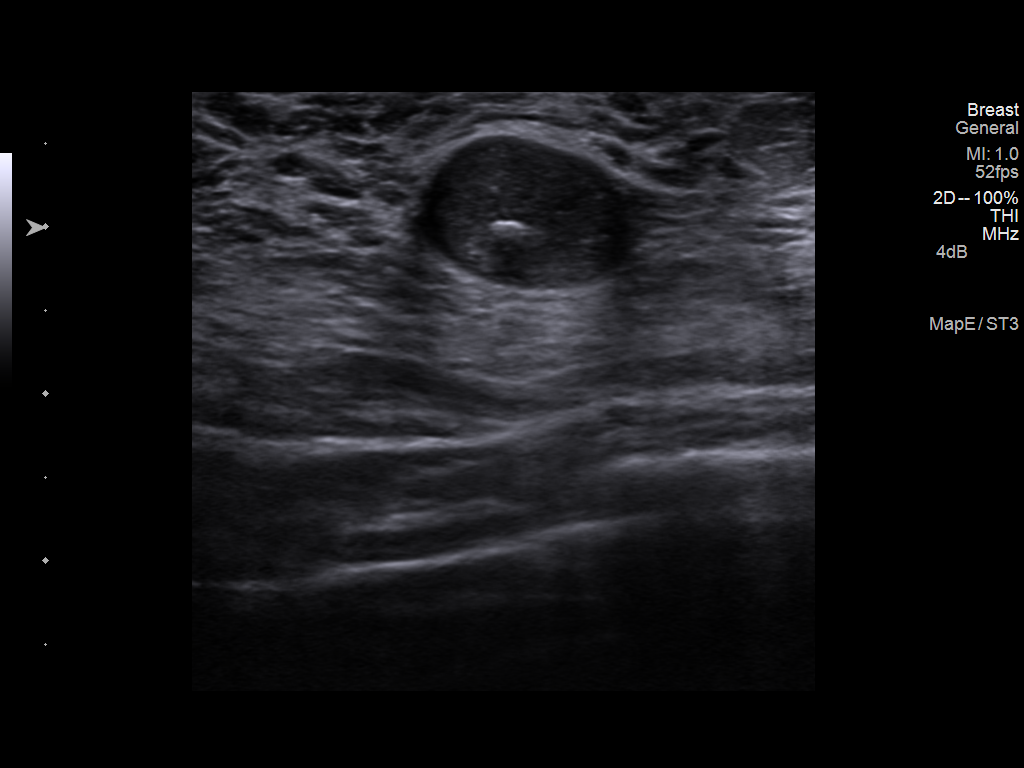
[im 2/4]
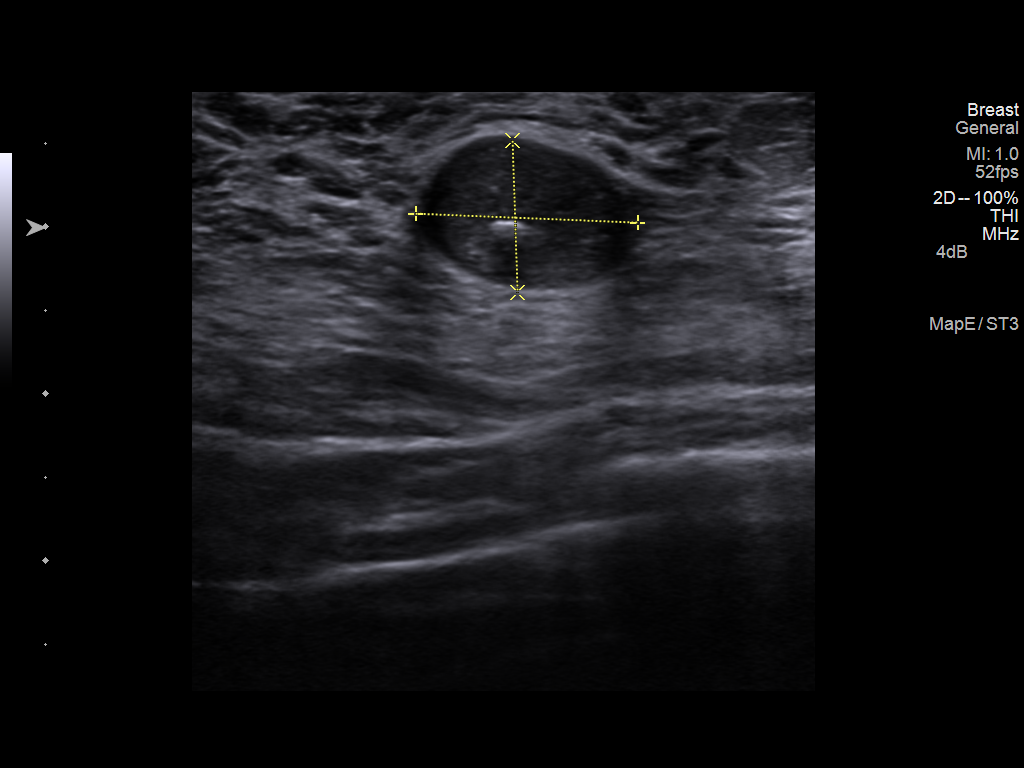
[im 3/4]
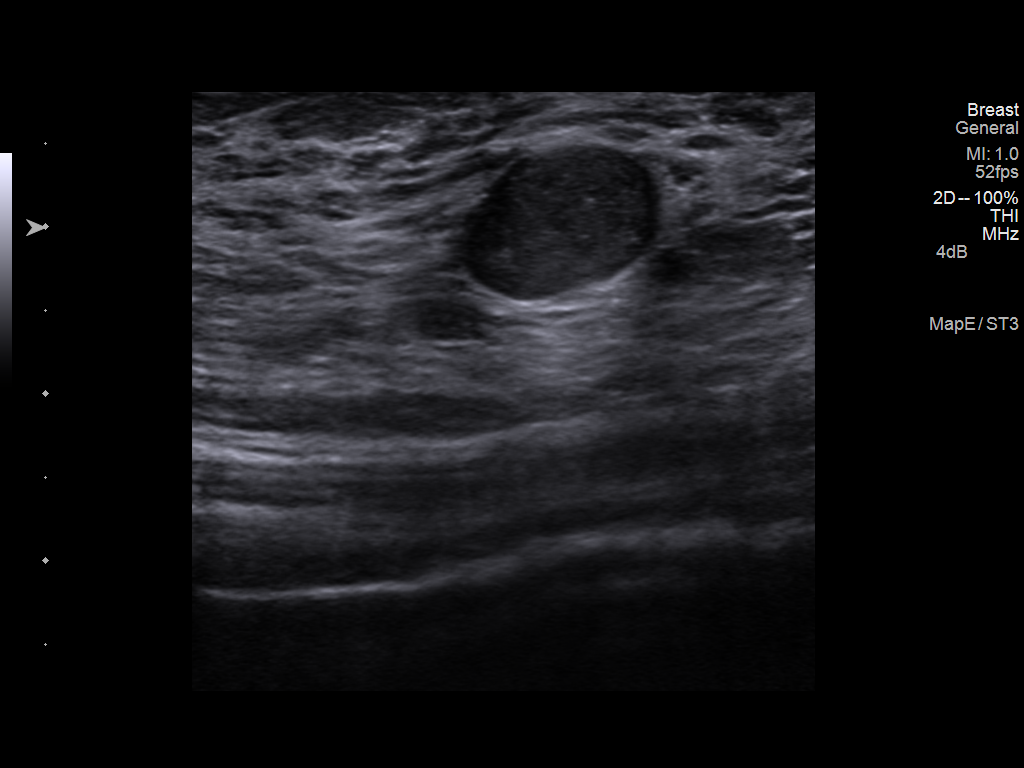
[im 4/4]
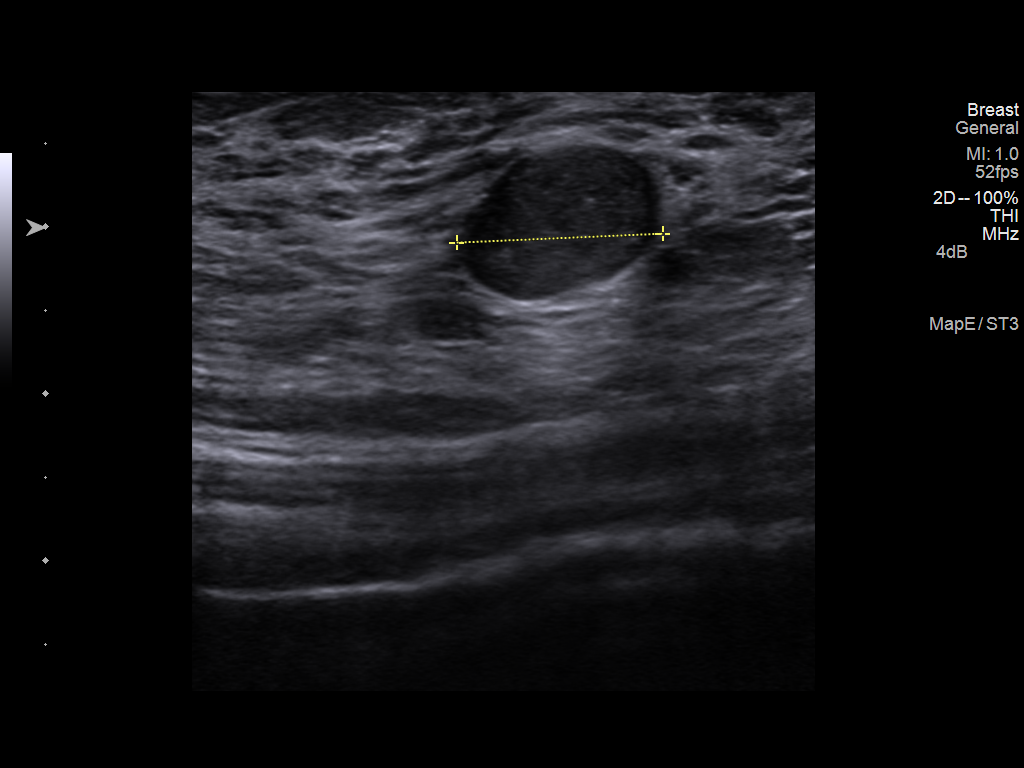

[4 of 4 positions shown; findings below may reference images not displayed]

FINDINGS: On physical exam,a mobile mass is palpated at 12 o'clock, 2 cm from
the nipple.

Targeted ultrasound demonstrates a hypoechoic circumscribed oval
mass at 12 o'clock, 2 cm from the nipple. This measures 1.3 x 0.9 x
1.2 cm. This is a significant decrease in size since the prior
examination in which it measured up to 3.8 cm, indicating benignity.
IMPRESSION: Benign left breast mass.

RECOMMENDATION:
Screening mammogram at age 41 unless there are persistent or
intervening clinical concerns. (Code:C1-K-SSL)

I have discussed the findings and recommendations with the patient.
Results were also provided in writing at the conclusion of the
visit. If applicable, a reminder letter will be sent to the patient
regarding the next appointment.

BI-RADS CATEGORY  2: Benign Finding(s)

## 2015-10-03 ENCOUNTER — Ambulatory Visit: Payer: Medicaid Other | Admitting: Obstetrics and Gynecology

## 2015-10-03 ENCOUNTER — Encounter: Payer: Self-pay | Admitting: Obstetrics and Gynecology

## 2016-01-09 ENCOUNTER — Other Ambulatory Visit (HOSPITAL_COMMUNITY)
Admission: RE | Admit: 2016-01-09 | Discharge: 2016-01-09 | Disposition: A | Discharge status: Home / Self Care | Payer: Medicaid Other | Source: Ambulatory Visit | Attending: Obstetrics & Gynecology | Admitting: Obstetrics & Gynecology

## 2016-01-09 ENCOUNTER — Ambulatory Visit (INDEPENDENT_AMBULATORY_CARE_PROVIDER_SITE_OTHER): Payer: Medicaid Other | Admitting: Women's Health

## 2016-01-09 ENCOUNTER — Encounter: Payer: Self-pay | Admitting: Women's Health

## 2016-01-09 VITALS — BP 100/52 | HR 64 | Ht 64.75 in | Wt 134.0 lb

## 2016-01-09 DIAGNOSIS — Z01419 Encounter for gynecological examination (general) (routine) without abnormal findings: Secondary | ICD-10-CM

## 2016-01-09 DIAGNOSIS — Z113 Encounter for screening for infections with a predominantly sexual mode of transmission: Secondary | ICD-10-CM | POA: Insufficient documentation

## 2016-01-09 DIAGNOSIS — Z0000 Encounter for general adult medical examination without abnormal findings: Secondary | ICD-10-CM | POA: Diagnosis not present

## 2016-01-09 NOTE — Patient Instructions (Signed)
Dr. Moshe Cipro and Meda Coffee [phone] Dr. Buelah Manis [phone] [facility] [phone]

## 2016-01-09 NOTE — Progress Notes (Signed)
Subjective:   Kayla Nicholson is a 25 y.o. G10P0101 African American female here for a routine well-woman exam.  Patient's last menstrual period was 12/18/2015.    Current complaints: wants STD screen, found out long term boyfriend cheated on her, has since broken up w/ him PCP: none, just moved back here from [facility]       Does desire labs, including STI screen  Social History: Sexual: heterosexual Marital Status: single Living situation: alone w/ daughter Occupation: [employer], admin support Tobacco/alcohol: no tobacco, etoh: weekends Illicit drugs: no history of illicit drug use  The following portions of the patient's history were reviewed and updated as appropriate: allergies, current medications, past family history, past medical history, past social history, past surgical history and problem list.  Past Medical History Past Medical History:  Diagnosis Date  . Back pain   . BV (bacterial vaginosis) 12/22/2013  . Contraceptive management 02/18/2014  . Irregular bleeding 07/27/2014  . LLQ discomfort 07/27/2014  . Urinary frequency 07/27/2014  . Yeast infection 04/27/2014    Past Surgical History Past Surgical History:  Procedure Laterality Date  . LAPAROSCOPIC OVARIAN CYSTECTOMY Right 08/25/2014   Procedure: LAPAROSCOPIC OVARIAN CYSTECTOMY;  Surgeon: Florian Buff, MD;  Location: [facility];  Service: Gynecology;  Laterality: Right;    Gynecologic History [id number]  Patient's last menstrual period was 12/18/2015. Contraception: abstinence, if resumes sexual activity plans condoms and will call [place] to get back on birth control  Last Pap: 2014. Results were: normal Last mammogram: never. Results were: n/a, had breast u/s ~65yrs ago, normal  Last TCS: never  Obstetric History OB History  Gravida Para Term Preterm AB Living  1 1   1   1   SAB TAB Ectopic Multiple Live Births               # Outcome Date GA Lbr Len/2nd Weight Sex Delivery Anes PTL Lv  1 Preterm               Current  Medications Current Outpatient Prescriptions on File Prior to Visit  Medication Sig Dispense Refill  . desogestrel-ethinyl estradiol (KARIVA,AZURETTE,MIRCETTE) 0.15-0.02/0.01 MG (21/5) tablet Take 1 tablet by mouth daily. (Patient not taking: Reported on 01/09/2016) 1 Package 11  . HYDROcodone-acetaminophen (NORCO/VICODIN) 5-325 MG per tablet Take 1 tablet by mouth every 6 (six) hours as needed. (Patient not taking: Reported on 01/09/2016) 30 tablet 0  . ketorolac (TORADOL) 10 MG tablet Take 1 tablet (10 mg total) by mouth every 8 (eight) hours as needed. (Patient not taking: Reported on 01/09/2016) 15 tablet 0  . medroxyPROGESTERone (DEPO-PROVERA) 150 MG/ML injection Inject 1 mL (150 mg total) into the muscle once. (Patient not taking: Reported on 01/09/2016) 1 mL 4  . ondansetron (ZOFRAN) 8 MG tablet Take 1 tablet (8 mg total) by mouth every 8 (eight) hours as needed for nausea. (Patient not taking: Reported on 01/09/2016) 12 tablet 0   No current facility-administered medications on file prior to visit.     Review of Systems Patient denies any headaches, blurred vision, shortness of breath, chest pain, abdominal pain, problems with bowel movements, urination, or intercourse.  Objective:  BP (!) 100/52 (BP Location: Right Arm, Patient Position: Sitting, Cuff Size: Normal)   Pulse 64   Ht 5' 4.75" (1.645 m)   Wt 134 lb (60.8 kg)   LMP 12/18/2015   BMI 22.47 kg/m  Physical Exam  General:  Well developed, well nourished, no acute distress. She is alert and oriented  x3. Skin:  Warm and dry Neck:  Midline trachea, no thyromegaly or nodules Cardiovascular: Regular rate and rhythm, no murmur heard Lungs:  Effort normal, all lung fields clear to auscultation bilaterally Breasts:  No dominant palpable mass, retraction, or nipple discharge Abdomen:  Soft, non tender, no hepatosplenomegaly or masses Pelvic:  External genitalia is normal in appearance.  The vagina is normal in appearance. The  cervix is bulbous, no CMT.  Thin prep pap is done w/ reflex HR HPV cotesting. Uterus is felt to be normal size, shape, and contour.  No adnexal masses or tenderness noted. Extremities:  No swelling or varicosities noted Psych:  She has a normal mood and affect  Assessment:   Healthy well-woman exam STD screen  Plan:  CBC, CMP, TSH, HIV, RPR, Hep B today, gc/ct from pap Call when ready to resume contraception F/U 73yr for physical, or sooner if needed Mammogram @24yo  or sooner if problems Colonoscopy @25yo  or sooner if problems  Tawnya Crook CNM, [facility] 01/09/2016 12:24 PM

## 2016-01-10 LAB — COMPREHENSIVE METABOLIC PANEL
ALK PHOS: 36 IU/L — AB (ref 39–117)
ALT: 7 IU/L (ref 0–32)
AST: 14 IU/L (ref 0–40)
Albumin/Globulin Ratio: 1.8 (ref 1.2–2.2)
Albumin: 4.4 g/dL (ref 3.5–5.5)
BILIRUBIN TOTAL: 0.5 mg/dL (ref 0.0–1.2)
BUN/Creatinine Ratio: 15 (ref 9–23)
BUN: 10 mg/dL (ref 6–20)
CHLORIDE: 105 mmol/L (ref 96–106)
CO2: 22 mmol/L (ref 18–29)
CREATININE: 0.67 mg/dL (ref 0.57–1.00)
Calcium: 9 mg/dL (ref 8.7–10.2)
GFR calc Af Amer: 142 mL/min/{1.73_m2} (ref >59)
GFR calc non Af Amer: 123 mL/min/{1.73_m2} (ref >59)
GLOBULIN, TOTAL: 2.4 g/dL (ref 1.5–4.5)
Glucose: 93 mg/dL (ref 65–99)
Potassium: 4.4 mmol/L (ref 3.5–5.2)
SODIUM: 140 mmol/L (ref 134–144)
Total Protein: 6.8 g/dL (ref 6.0–8.5)

## 2016-01-10 LAB — CBC
HEMATOCRIT: 39.8 % (ref 34.0–46.6)
Hemoglobin: 13.3 g/dL (ref 11.1–15.9)
MCH: 31 pg (ref 26.6–33.0)
MCHC: 33.4 g/dL (ref 31.5–35.7)
MCV: 93 fL (ref 79–97)
Platelets: 204 10*3/uL (ref 150–379)
RBC: 4.29 x10E6/uL (ref 3.77–5.28)
RDW: 12.7 % (ref 12.3–15.4)
WBC: 7.4 10*3/uL (ref 3.4–10.8)

## 2016-01-10 LAB — RPR: RPR Ser Ql: NONREACTIVE

## 2016-01-10 LAB — CYTOLOGY - PAP
CHLAMYDIA, DNA PROBE: NEGATIVE
DIAGNOSIS: NEGATIVE
Neisseria Gonorrhea: NEGATIVE

## 2016-01-10 LAB — HIV ANTIBODY (ROUTINE TESTING W REFLEX): HIV Screen 4th Generation wRfx: NONREACTIVE

## 2016-01-10 LAB — TSH: TSH: 0.801 u[IU]/mL (ref 0.450–4.500)

## 2016-01-10 LAB — HEPATITIS B SURFACE ANTIGEN: HEP B S AG: NEGATIVE

## 2016-01-12 ENCOUNTER — Encounter: Payer: Self-pay | Admitting: Women's Health

## 2016-01-13 ENCOUNTER — Telehealth: Payer: Self-pay | Admitting: Women's Health

## 2016-01-13 NOTE — Telephone Encounter (Signed)
Spoke with pt letting her know her RPR and HIV was negative. Pap was normal and GC/CHL was negative. Pt wants to come by Monday and get a copy of results. I advised that was fine.  [place]

## 2016-03-20 ENCOUNTER — Telehealth: Payer: Self-pay | Admitting: *Deleted

## 2016-03-20 NOTE — Telephone Encounter (Signed)
Spoke with pt. Pt started Nuva Ring 1 and a half weeks ago. After sex Saturday, the ring was on the bed. Pt didn't put ring back in. I spoke with JAG and she advised needs to take Plan B today and start over with Nuva Ring. Pt to come by office and pick up Nuva ring sample. Lot # [id number] exp 05/2018. Pt to pick up sample at front desk. Pt voiced understanding. [place]

## 2016-03-20 NOTE — Telephone Encounter (Signed)
Left message x 1. JSY 

## 2016-04-11 ENCOUNTER — Encounter (HOSPITAL_COMMUNITY): Payer: Self-pay | Admitting: Emergency Medicine

## 2016-04-11 ENCOUNTER — Emergency Department (HOSPITAL_COMMUNITY)
Admission: EM | Admit: 2016-04-11 | Discharge: 2016-04-11 | Disposition: A | Discharge status: Home / Self Care | Payer: Medicaid Other | Attending: Emergency Medicine | Admitting: Emergency Medicine

## 2016-04-11 DIAGNOSIS — Z791 Long term (current) use of non-steroidal anti-inflammatories (NSAID): Secondary | ICD-10-CM | POA: Diagnosis not present

## 2016-04-11 DIAGNOSIS — N760 Acute vaginitis: Secondary | ICD-10-CM | POA: Insufficient documentation

## 2016-04-11 DIAGNOSIS — J020 Streptococcal pharyngitis: Secondary | ICD-10-CM

## 2016-04-11 DIAGNOSIS — B9689 Other specified bacterial agents as the cause of diseases classified elsewhere: Secondary | ICD-10-CM

## 2016-04-11 DIAGNOSIS — J029 Acute pharyngitis, unspecified: Secondary | ICD-10-CM | POA: Diagnosis present

## 2016-04-11 MED ORDER — METRONIDAZOLE 500 MG PO TABS: 500.0000 mg | ORAL_TABLET | Freq: Two times a day (BID) | ORAL | 0 refills | Status: DC

## 2016-04-11 MED ORDER — PENICILLIN G BENZATHINE 1200000 UNIT/2ML IM SUSP
1.2000 10*6.[IU] | Freq: Once | INTRAMUSCULAR | Status: AC
  Administered 2016-04-11: 1.2 10*6.[IU] via INTRAMUSCULAR
  Filled 2016-04-11: qty 2

## 2016-04-11 NOTE — ED Triage Notes (Signed)
Having cold symptoms since Sunday.  C/o sore throat and also c/o vagina itching.  Having foul smelling discharge for 2 days.

## 2016-04-11 NOTE — Discharge Instructions (Signed)
Take your antibiotics as prescribed until completed. You may continue taking Tylenol and/or ibuprofen as prescribed over-the-counter as seen for fever and body aches. Continue drinking fluids at home to remain hydrated. Please follow up with a primary care provider from the Resource Guide provided below in one week as needed. Please return to the Emergency Department if symptoms worsen or new onset of fever, chest pain, difficulty breathing, abdominal pain, vomiting, vaginal bleeding.

## 2016-04-11 NOTE — ED Provider Notes (Signed)
[facility] DEPT Provider Note   CSN: [id number] Arrival date & time: 04/11/16  1328     History   Chief Complaint Chief Complaint  Patient presents with  . Sore Throat    HPI Kayla Nicholson is a 26 y.o. female.  HPI   Patient is a 26 year old female with no pertinent past medical history percent to the ED with multiple complaints. Patient reports over the past 3 days she has had sore throat with associated nasal congestion, rhinorrhea, cough, chills and body aches. She notes her daughter has also been sick this week with similar symptoms. Patient denies taking any medications for her symptoms. Patient reports she was evaluated at urgent care earlier, was diagnosed with strep but states due to the physician having a medical problem they had to leave the clinic prior to receiving treatment resulting in the patient coming to the ED. Denies fever, headache, neck stiffness, shortness of breath, wheezing, chest pain, hemoptysis, abdominal pain, vomiting, diarrhea, urinary symptoms, rash.  Patient also reports she has had a malodorous vaginal discharge for the past 5 days. Patient reports recently changing her soap but denies any history of vaginal douching. Patient states she has had vaginal yeast infections in the past but states that these symptoms are not consistent with her typical yeast infections. Patient states she was evaluated at the urgent care also regarding her discharge and states she was diagnosed with BV but did not receive treatment. Denies Donald pain, nausea, vomiting, urinary symptoms, vaginal bleeding. LMP 03/31/16.   Past Medical History:  Diagnosis Date  . Back pain   . BV (bacterial vaginosis) 12/22/2013  . Contraceptive management 02/18/2014  . Irregular bleeding 07/27/2014  . LLQ discomfort 07/27/2014  . Urinary frequency 07/27/2014  . Yeast infection 04/27/2014    Patient Active Problem List   Diagnosis Date Noted  . Urinary frequency 07/27/2014  . Irregular  bleeding 07/27/2014  . LLQ discomfort 07/27/2014  . Yeast infection 04/27/2014  . Contraceptive management 02/18/2014  . Vaginal discharge 12/22/2013  . BV (bacterial vaginosis) 12/22/2013  . Routine gynecological examination 10/23/2012    Past Surgical History:  Procedure Laterality Date  . LAPAROSCOPIC OVARIAN CYSTECTOMY Right 08/25/2014   Procedure: LAPAROSCOPIC OVARIAN CYSTECTOMY;  Surgeon: Florian Buff, MD;  Location: [facility];  Service: Gynecology;  Laterality: Right;    OB History    Gravida Para Term Preterm AB Living   1 1   1   1    SAB TAB Ectopic Multiple Live Births                   Home Medications    Prior to Admission medications   Medication Sig Start Date End Date Taking? Authorizing Provider  ibuprofen (ADVIL,MOTRIN) 200 MG tablet Take 200 mg by mouth every 6 (six) hours as needed for fever or moderate pain.   Yes Historical Provider, MD  desogestrel-ethinyl estradiol (KARIVA,AZURETTE,MIRCETTE) 0.15-0.02/0.01 MG (21/5) tablet Take 1 tablet by mouth daily. Patient not taking: Reported on 01/09/2016 09/15/14   Florian Buff, MD  medroxyPROGESTERone (DEPO-PROVERA) 150 MG/ML injection Inject 1 mL (150 mg total) into the muscle once. Patient not taking: Reported on 01/09/2016 05/17/14   Estill Dooms, NP  metroNIDAZOLE (FLAGYL) 500 MG tablet Take 1 tablet (500 mg total) by mouth 2 (two) times daily. 04/11/16   Nona Dell, PA-C    Family History Family History  Problem Relation Age of Onset  . Hypertension Mother   . Ovarian cysts Sister   .  Hypertension Maternal Grandmother   . Diabetes Maternal Grandmother   . Ovarian cysts Maternal Grandmother   . Hernia Daughter     Social History Social History  Substance Use Topics  . Smoking status: Never Smoker  . Smokeless tobacco: Never Used  . Alcohol use Yes     Comment: occassional     Allergies   Bee venom and Macrobid [nitrofurantoin monohyd macro]   Review of Systems Review of  Systems  Constitutional: Positive for chills.  HENT: Positive for congestion and sore throat.   Respiratory: Positive for cough.   Genitourinary: Positive for vaginal discharge.  Musculoskeletal: Positive for myalgias (generalized).  All other systems reviewed and are negative.    Physical Exam Updated Vital Signs BP 118/68 (BP Location: Left Arm)   Pulse 87   Temp 99.1 F (37.3 C) (Oral)   Resp 16   Ht 5\' 5"  (1.651 m)   Wt 61.2 kg   LMP 03/31/2016 (Exact Date)   SpO2 100%   BMI 22.47 kg/m   Physical Exam  Constitutional: She is oriented to person, place, and time. She appears well-developed and well-nourished. No distress.  HENT:  Head: Normocephalic and atraumatic.  Right Ear: Tympanic membrane normal.  Left Ear: Tympanic membrane normal.  Nose: Rhinorrhea present. Right sinus exhibits no maxillary sinus tenderness and no frontal sinus tenderness. Left sinus exhibits no maxillary sinus tenderness and no frontal sinus tenderness.  Mouth/Throat: Uvula is midline and mucous membranes are normal. Oropharyngeal exudate and posterior oropharyngeal erythema present. No posterior oropharyngeal edema or tonsillar abscesses. Tonsils are 1+ on the right. Tonsils are 1+ on the left. Tonsillar exudate.  No trismus, drooling, facial/neck swelling or stridor on exam. Floor of mouth soft. Patient tolerating secretions.  Eyes: Conjunctivae and EOM are normal. Right eye exhibits no discharge. Left eye exhibits no discharge. No scleral icterus.  Neck: Normal range of motion. Neck supple.  Cardiovascular: Normal rate, regular rhythm, normal heart sounds and intact distal pulses.   Pulmonary/Chest: Effort normal and breath sounds normal. No respiratory distress. She has no wheezes. She has no rales. She exhibits no tenderness.  Abdominal: Soft. Bowel sounds are normal. She exhibits no distension and no mass. There is no tenderness. There is no rebound and no guarding. No hernia.  Musculoskeletal:  She exhibits no edema.  Lymphadenopathy:    She has no cervical adenopathy.  Neurological: She is alert and oriented to person, place, and time.  Skin: Skin is warm and dry. She is not diaphoretic.  Nursing note and vitals reviewed.    ED Treatments / Results  Labs (all labs ordered are listed, but only abnormal results are displayed) Labs Reviewed - No data to display  EKG  EKG Interpretation None       Radiology No results found.  Procedures Procedures (including critical care time)  Medications Ordered in ED Medications  penicillin g benzathine (BICILLIN LA) 1200000 UNIT/2ML injection 1.2 Million Units (1.2 Million Units Intramuscular Given 04/11/16 1425)     Initial Impression / Assessment and Plan / ED Course  I have reviewed the triage vital signs and the nursing notes.  Pertinent labs & imaging results that were available during my care of the patient were reviewed by me and considered in my medical decision making (see chart for details).     Patient presents with sore throat, nasal congestion, cough and generalized body aches. Reports her daughter is also been sick with similar symptoms this week. She states she was initially  evaluated at an urgent care earlier, was diagnosed with strep throat however she had to leave the clinic prior to being treated due to the position having a medical problem. She also reports having malodorous vaginal discharge for the past 4 days and states she was diagnosed with BV at the urgent care. Denies fever, abdominal pain, vomiting, vaginal bleeding. VSS. Exam revealed erythema with exudate to posterior oral pharynx. Patient tolerating secretions, no trismus or drooling. Abdominal exam benign. Lungs clear to auscultation bilaterally. Patient treated in the ED with single dose of IM penicillin. Plan to discharge patient home with Flagyl for BV and also discussed symptomatic treatment for strep. Discussed return precautions.   Final  Clinical Impressions(s) / ED Diagnoses   Final diagnoses:  Strep throat  BV (bacterial vaginosis)    New Prescriptions New Prescriptions   METRONIDAZOLE (FLAGYL) 500 MG TABLET    Take 1 tablet (500 mg total) by mouth 2 (two) times daily.     Chesley Noon Latta, [place] 04/11/16 [place], MD 04/12/16 [phone]

## 2016-04-16 ENCOUNTER — Ambulatory Visit: Payer: Medicaid Other | Admitting: Obstetrics and Gynecology

## 2016-07-09 ENCOUNTER — Ambulatory Visit: Payer: Medicaid Other | Admitting: Adult Health

## 2016-08-09 ENCOUNTER — Ambulatory Visit: Payer: Medicaid Other | Admitting: Adult Health

## 2016-08-09 ENCOUNTER — Encounter: Payer: Self-pay | Admitting: *Deleted

## 2016-09-25 ENCOUNTER — Ambulatory Visit (INDEPENDENT_AMBULATORY_CARE_PROVIDER_SITE_OTHER): Payer: Medicaid Other | Admitting: Women's Health

## 2016-09-25 ENCOUNTER — Encounter: Payer: Self-pay | Admitting: Women's Health

## 2016-09-25 VITALS — BP 90/58 | HR 76 | Ht 65.0 in | Wt 127.5 lb

## 2016-09-25 DIAGNOSIS — Z3009 Encounter for other general counseling and advice on contraception: Secondary | ICD-10-CM

## 2016-09-25 DIAGNOSIS — Z308 Encounter for other contraceptive management: Secondary | ICD-10-CM | POA: Diagnosis not present

## 2016-09-25 DIAGNOSIS — Z113 Encounter for screening for infections with a predominantly sexual mode of transmission: Secondary | ICD-10-CM

## 2016-09-25 DIAGNOSIS — Z01419 Encounter for gynecological examination (general) (routine) without abnormal findings: Secondary | ICD-10-CM

## 2016-09-25 NOTE — Progress Notes (Signed)
Subjective:   Kayla Nicholson is a 26 y.o. G33P0101 African American female here for a FP Mc[facility] routine well-woman exam.  Patient's last menstrual period was 09/15/2016 (exact date).    Current complaints: wants STD screen, had unprotected sex, no sx Had pulling RLQ the other day, almost to point of going to hospital, but was gone when she woke up and has not returned. 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  PCP: none, wants list        Social History: Sexual: heterosexual Marital Status: single Living situation: w/ her child Occupation: works at [employer] Tobacco/alcohol: no tobacco, social etoh Illicit drugs: no history of illicit drug use  The following portions of the patient's history were reviewed and updated as appropriate: allergies, current medications, past family history, past medical history, past social history, past surgical history and problem list.  Past Medical History Past Medical History:  Diagnosis Date  . Back pain   . BV (bacterial vaginosis) 12/22/2013  . Contraceptive management 02/18/2014  . Irregular bleeding 07/27/2014  . LLQ discomfort 07/27/2014  . Urinary frequency 07/27/2014  . Yeast infection 04/27/2014    Past Surgical History Past Surgical History:  Procedure Laterality Date  . LAPAROSCOPIC OVARIAN CYSTECTOMY Right 08/25/2014   Procedure: LAPAROSCOPIC OVARIAN CYSTECTOMY;  Surgeon: Florian Buff, MD;  Location: [facility];  Service: Gynecology;  Laterality: Right;    Gynecologic History G1P0101  Patient's last menstrual period was 09/15/2016 (exact date). Contraception: condoms, does not want hormonal contraception Last Pap: 01/09/16. Results were: normal Last mammogram: never. Results were: n/a, had breast u/s ~52yrs ago, normal Last TCS: never  Obstetric History OB History  Gravida Para Term Preterm AB Living  1 1   1   1   SAB TAB Ectopic Multiple Live Births          1    # Outcome Date GA Lbr Len/2nd Weight Sex Delivery Anes PTL  Lv  1 Preterm  [redacted]w[redacted]d   F Vag-Spont   LIV      Current Medications No current outpatient prescriptions on file prior to visit.   No current facility-administered medications on file prior to visit.     Review of Systems Patient denies any headaches, blurred vision, shortness of breath, chest pain, abdominal pain, problems with bowel movements, urination, or intercourse.  Objective:  BP (!) 90/58 (BP Location: Left Arm, Patient Position: Sitting, Cuff Size: Normal)   Pulse 76   Ht 5\' 5"  (1.651 m)   Wt 127 lb 8 oz (57.8 kg)   LMP 09/15/2016 (Exact Date)   BMI 21.22 kg/m  Physical Exam  General:  Well developed, well nourished, no acute distress. She is alert and oriented x3. Skin:  Warm and dry Neck:  Midline trachea, no thyromegaly or nodules Cardiovascular: Regular rate and rhythm, no murmur heard Lungs:  Effort normal, all lung fields clear to auscultation bilaterally Breasts:  No dominant palpable mass, retraction, or nipple discharge Abdomen:  Soft, non tender, no hepatosplenomegaly or masses Pelvic:  External genitalia is normal in appearance.  The vagina is normal in appearance. The cervix is bulbous, no CMT.  Thin prep pap is not done. Uterus is felt to be normal size, shape, and contour.  No adnexal masses or tenderness noted. Extremities:  No swelling or varicosities noted Psych:  She has a normal mood and affect  Assessment:   Healthy FP Mcaid well-woman exam STD screen 1 episode RLQ pain w/ h/o Rt ovarian cystectomy/benign teratoma  Plan:  GC/CT, HIV, RPR today F/U 63yr for physical, or sooner if needed Discussed RLQ could possibly have been from scar tissue/adhesions, another ovarian cyst, bowel issues, etc- her insurance will not cover pelvic u/s, discussed if pain returns and is severe and not resolving let us know or go to ED Condoms always for STI/pregnancy prevention Mammogram @ 25yo or sooner if problems Colonoscopy @26yo  or sooner if problems  Tawnya Crook CNM, [facility] 09/25/2016 4:32 PM

## 2016-09-25 NOTE — Patient Instructions (Signed)
Primary Care Providers  Dr. Zack Hall ([place]) [phone]  [facility] [phone]  [facility] ([place]) Dr. Gosrani [phone]  [facility] ([place]) [phone]  [facility] ([place]) [phone]  [facility] [phone]   

## 2016-09-26 LAB — HIV ANTIBODY (ROUTINE TESTING W REFLEX): HIV Screen 4th Generation wRfx: NONREACTIVE

## 2016-09-26 LAB — RPR: RPR Ser Ql: NONREACTIVE

## 2016-09-27 ENCOUNTER — Telehealth: Payer: Self-pay | Admitting: *Deleted

## 2016-09-27 LAB — GC/CHLAMYDIA PROBE AMP
Chlamydia trachomatis, NAA: NEGATIVE
Neisseria gonorrhoeae by PCR: NEGATIVE

## 2016-09-27 NOTE — Telephone Encounter (Signed)
Informed patient all tests were negative. Verbalized gratitude.  

## 2016-11-05 ENCOUNTER — Emergency Department (HOSPITAL_COMMUNITY)
Admission: EM | Admit: 2016-11-05 | Discharge: 2016-11-06 | Disposition: A | Discharge status: Home / Self Care | Payer: Medicaid Other | Attending: Emergency Medicine | Admitting: Emergency Medicine

## 2016-11-05 ENCOUNTER — Encounter (HOSPITAL_COMMUNITY): Payer: Self-pay | Admitting: Emergency Medicine

## 2016-11-05 DIAGNOSIS — B373 Candidiasis of vulva and vagina: Secondary | ICD-10-CM

## 2016-11-05 DIAGNOSIS — N760 Acute vaginitis: Secondary | ICD-10-CM | POA: Diagnosis not present

## 2016-11-05 DIAGNOSIS — B3731 Acute candidiasis of vulva and vagina: Secondary | ICD-10-CM

## 2016-11-05 DIAGNOSIS — R102 Pelvic and perineal pain: Secondary | ICD-10-CM | POA: Insufficient documentation

## 2016-11-05 DIAGNOSIS — B9689 Other specified bacterial agents as the cause of diseases classified elsewhere: Secondary | ICD-10-CM

## 2016-11-05 DIAGNOSIS — R1030 Lower abdominal pain, unspecified: Secondary | ICD-10-CM | POA: Diagnosis present

## 2016-11-05 LAB — COMPREHENSIVE METABOLIC PANEL
ALK PHOS: 27 U/L — AB (ref 38–126)
ALT: 10 U/L — AB (ref 14–54)
AST: 19 U/L (ref 15–41)
Albumin: 4.1 g/dL (ref 3.5–5.0)
Anion gap: 6 (ref 5–15)
BUN: 10 mg/dL (ref 6–20)
CO2: 25 mmol/L (ref 22–32)
CREATININE: 0.64 mg/dL (ref 0.44–1.00)
Calcium: 8.9 mg/dL (ref 8.9–10.3)
Chloride: 106 mmol/L (ref 101–111)
GFR calc non Af Amer: >60 mL/min (ref >60)
Glucose, Bld: 111 mg/dL — ABNORMAL HIGH (ref 65–99)
Potassium: 3.1 mmol/L — ABNORMAL LOW (ref 3.5–5.1)
SODIUM: 137 mmol/L (ref 135–145)
Total Bilirubin: 0.7 mg/dL (ref 0.3–1.2)
Total Protein: 7.1 g/dL (ref 6.5–8.1)

## 2016-11-05 LAB — CBC
HEMATOCRIT: 37.2 % (ref 36.0–46.0)
Hemoglobin: 13.2 g/dL (ref 12.0–15.0)
MCH: 32.6 pg (ref 26.0–34.0)
MCHC: 35.5 g/dL (ref 30.0–36.0)
MCV: 91.9 fL (ref 78.0–100.0)
PLATELETS: 165 10*3/uL (ref 150–400)
RBC: 4.05 MIL/uL (ref 3.87–5.11)
RDW: 12.2 % (ref 11.5–15.5)
WBC: 9 10*3/uL (ref 4.0–10.5)

## 2016-11-05 LAB — LIPASE, BLOOD: Lipase: 26 U/L (ref 11–51)

## 2016-11-05 NOTE — ED Triage Notes (Signed)
Pt c/o right lower abd pain with white vaginal discharge x 3 days.

## 2016-11-05 NOTE — ED Provider Notes (Signed)
[place] DEPT Provider Note   CSN: [id number] Arrival date & time: 11/05/16  2246     History   Chief Complaint Chief Complaint  Patient presents with  . Abdominal Pain    HPI Kayla Nicholson is a 26 y.o. female.  Patient presents to the emergency department for evaluation of abdominal pain. Patient reports that she has been experiencing bilateral lower abdominal and pelvic pain for 3 days. She reports a previous history of recurrent ovarian cyst on the right causing similar pain. She has not, however, ever had pain on the left. She comes to the ER because of this new pain, although it feels similar to her ovarian pain on the other side. Pain has been coming and going. She has had a white vaginal discharge has been ongoing for more than a week. This started before the pain. She has not had any fever. No nausea, vomiting, diarrhea, constipation.      Past Medical History:  Diagnosis Date  . Back pain   . BV (bacterial vaginosis) 12/22/2013  . Contraceptive management 02/18/2014  . Irregular bleeding 07/27/2014  . LLQ discomfort 07/27/2014  . Urinary frequency 07/27/2014  . Yeast infection 04/27/2014    Patient Active Problem List   Diagnosis Date Noted  . Irregular bleeding 07/27/2014  . Yeast infection 04/27/2014  . BV (bacterial vaginosis) 12/22/2013    Past Surgical History:  Procedure Laterality Date  . LAPAROSCOPIC OVARIAN CYSTECTOMY Right 08/25/2014   Procedure: LAPAROSCOPIC OVARIAN CYSTECTOMY;  Surgeon: Florian Buff, MD;  Location: [facility];  Service: Gynecology;  Laterality: Right;    OB History    Gravida Para Term Preterm AB Living   1 1   1   1    SAB TAB Ectopic Multiple Live Births           1       Home Medications    Prior to Admission medications   Not on File    Family History Family History  Problem Relation Age of Onset  . Hypertension Mother   . Ovarian cysts Sister   . Hypertension Maternal Grandmother   . Diabetes Maternal  Grandmother   . Ovarian cysts Maternal Grandmother   . Hernia Daughter     Social History Social History  Substance Use Topics  . Smoking status: Never Smoker  . Smokeless tobacco: Never Used  . Alcohol use Yes     Comment: occassional     Allergies   Bee venom and Macrobid [nitrofurantoin monohyd macro]   Review of Systems Review of Systems  Genitourinary: Positive for pelvic pain and vaginal discharge.  All other systems reviewed and are negative.    Physical Exam Updated Vital Signs BP 112/69 (BP Location: Right Arm)   Pulse 78   Temp 99 F (37.2 C) (Oral)   Resp 15   Ht 5\' 6"  (1.676 m)   Wt 57.2 kg (126 lb)   LMP 10/11/2016   SpO2 100%   BMI 20.34 kg/m   Physical Exam  Constitutional: She is oriented to person, place, and time. She appears well-developed and well-nourished. No distress.  HENT:  Head: Normocephalic and atraumatic.  Right Ear: Hearing normal.  Left Ear: Hearing normal.  Nose: Nose normal.  Mouth/Throat: Oropharynx is clear and moist and mucous membranes are normal.  Eyes: Pupils are equal, round, and reactive to light. Conjunctivae and EOM are normal.  Neck: Normal range of motion. Neck supple.  Cardiovascular: Regular rhythm, S1 normal and S2 normal.  Exam reveals no gallop and no friction rub.   No murmur heard. Pulmonary/Chest: Effort normal and breath sounds normal. No respiratory distress. She exhibits no tenderness.  Abdominal: Soft. Normal appearance and bowel sounds are normal. There is no hepatosplenomegaly. There is no tenderness. There is no rebound, no guarding, no tenderness at McBurney's point and negative Murphy's sign. No hernia.  Genitourinary: Vagina normal. Cervix exhibits no motion tenderness, no discharge and no friability. Right adnexum displays no mass and no tenderness. Left adnexum displays tenderness. Left adnexum displays no mass.  Musculoskeletal: Normal range of motion.  Neurological: She is alert and oriented to  person, place, and time. She has normal strength. No cranial nerve deficit or sensory deficit. Coordination normal. GCS eye subscore is 4. GCS verbal subscore is 5. GCS motor subscore is 6.  Skin: Skin is warm, dry and intact. No rash noted. No cyanosis.  Psychiatric: She has a normal mood and affect. Her speech is normal and behavior is normal. Thought content normal.  Nursing note and vitals reviewed.    ED Treatments / Results  Labs (all labs ordered are listed, but only abnormal results are displayed) Labs Reviewed  WET PREP, GENITAL - Abnormal; Notable for the following:       Result Value   Yeast Wet Prep HPF POC PRESENT (*)    Clue Cells Wet Prep HPF POC PRESENT (*)    WBC, Wet Prep HPF POC RARE (*)    All other components within normal limits  COMPREHENSIVE METABOLIC PANEL - Abnormal; Notable for the following:    Potassium 3.1 (*)    Glucose, Bld 111 (*)    ALT 10 (*)    Alkaline Phosphatase 27 (*)    All other components within normal limits  LIPASE, BLOOD  CBC  URINALYSIS, ROUTINE W REFLEX MICROSCOPIC  PREGNANCY, URINE  GC/CHLAMYDIA PROBE AMP ([place]) NOT AT [facility]    EKG  EKG Interpretation None       Radiology No results found.  Procedures Procedures (including critical care time)  Medications Ordered in ED Medications - No data to display   Initial Impression / Assessment and Plan / ED Course  I have reviewed the triage vital signs and the nursing notes.  Pertinent labs & imaging results that were available during my care of the patient were reviewed by me and considered in my medical decision making (see chart for details).     Patient presents with complaints of pelvic pain. Symptoms present for 3 days. She has a history of right ovarian cyst causing recurrent pain. She is now experiencing pain on the left which she has never had before. She does, however, report that the pain is similar. She appears to be comfortable. Vital signs are stable.  Pelvic exam revealed mild tenderness in the left adnexal region without mass. Suspect small cyst, no concern for hemorrhagic cyst. Pain is waxing and waning, do not suspect ovarian torsion at this time. [facility] she has been seen in the past.  Patient does complain of vaginal discharge which has been ongoing for longer period of time. Preprepped that showed [place] as well as clue cells, will treat with Diflucan and Flagyl. There is no evidence of cervical motion tenderness, do not suspect PID. GC and chlamydia testing pending.  Final Clinical Impressions(s) / ED Diagnoses   Final diagnoses:  Vaginal yeast infection  BV (bacterial vaginosis)  Pelvic pain in female    New Prescriptions New  Prescriptions   No medications on file     Orpah Greek, MD 11/06/16 [phone]

## 2016-11-06 LAB — WET PREP, GENITAL
Sperm: NONE SEEN
Trich, Wet Prep: NONE SEEN

## 2016-11-06 MED ORDER — IBUPROFEN 800 MG PO TABS: 800.0000 mg | ORAL_TABLET | Freq: Three times a day (TID) | ORAL | 0 refills | Status: DC

## 2016-11-06 MED ORDER — FLUCONAZOLE 150 MG PO TABS: 150.0000 mg | ORAL_TABLET | Freq: Once | ORAL | 0 refills | Status: AC

## 2016-11-06 MED ORDER — TRAMADOL HCL 50 MG PO TABS: 50.0000 mg | ORAL_TABLET | Freq: Four times a day (QID) | ORAL | 0 refills | Status: DC | PRN

## 2016-11-06 MED ORDER — METRONIDAZOLE 500 MG PO TABS: 500.0000 mg | ORAL_TABLET | Freq: Two times a day (BID) | ORAL | 0 refills | Status: DC

## 2016-11-06 NOTE — ED Notes (Signed)
Pt ambulatory to waiting room. Pt verbalized understanding of discharge instructions.   

## 2016-11-07 LAB — GC/CHLAMYDIA PROBE AMP (~~LOC~~) NOT AT ARMC
Chlamydia: NEGATIVE
NEISSERIA GONORRHEA: NEGATIVE

## 2016-11-09 ENCOUNTER — Ambulatory Visit: Payer: Medicaid Other | Admitting: Obstetrics and Gynecology

## 2016-11-09 ENCOUNTER — Encounter: Payer: Self-pay | Admitting: Obstetrics and Gynecology

## 2016-11-15 ENCOUNTER — Encounter: Payer: Self-pay | Admitting: *Deleted

## 2016-11-15 ENCOUNTER — Encounter: Payer: Self-pay | Admitting: Women's Health

## 2016-11-15 ENCOUNTER — Ambulatory Visit (INDEPENDENT_AMBULATORY_CARE_PROVIDER_SITE_OTHER): Payer: Medicaid Other | Admitting: Women's Health

## 2016-11-15 VITALS — BP 92/56 | HR 80 | Ht 65.0 in | Wt 129.0 lb

## 2016-11-15 DIAGNOSIS — R102 Pelvic and perineal pain: Secondary | ICD-10-CM | POA: Diagnosis not present

## 2016-11-15 DIAGNOSIS — N898 Other specified noninflammatory disorders of vagina: Secondary | ICD-10-CM

## 2016-11-15 DIAGNOSIS — Z8742 Personal history of other diseases of the female genital tract: Secondary | ICD-10-CM

## 2016-11-15 DIAGNOSIS — B9689 Other specified bacterial agents as the cause of diseases classified elsewhere: Secondary | ICD-10-CM

## 2016-11-15 DIAGNOSIS — N760 Acute vaginitis: Secondary | ICD-10-CM | POA: Diagnosis not present

## 2016-11-15 LAB — POCT WET PREP (WET MOUNT)
Clue Cells Wet Prep Whiff POC: POSITIVE
TRICHOMONAS WET PREP HPF POC: ABSENT

## 2016-11-15 MED ORDER — METRONIDAZOLE 0.75 % VA GEL: 1.0000 | Freq: Every day | VAGINAL | 0 refills | Status: DC

## 2016-11-15 NOTE — Progress Notes (Signed)
   [facility] Visit  Patient name: Kayla Nicholson MRN [id number]  Date of birth: 04/16/1990  CC & HPI:  Kayla Nicholson is a 26 y.o. G83P0101 African American female presenting today for report of vag d/c and irritation. Went to ED 11/05/16 w/ pelvic pain like her ovarian cyst in past, wet prep was + for yeast and clue cells, was rx'd metronidazole and diflucan. Was unable to take metronidazole- made her sick. GC/CT neg. Still having some pelvic pain, not bad, but does want to have u/s to see if has ovarian cyst. Also thinks condoms may be irritating her, started using new ones w/ lubrication.  Patient's last menstrual period was 11/08/2016. The current method of family planning is condoms, thinking about other methods, but not ready to try anything else right now Last pap 01/09/16, normal  Pertinent History Reviewed:  Medical & Surgical Hx:   Past medical, surgical, family, and social history reviewed in electronic medical record Medications: Reviewed & Updated - see associated section Allergies: Reviewed in electronic medical record  Objective Findings:  Vitals: BP (!) 92/56 (BP Location: Right Arm, Patient Position: Sitting, Cuff Size: Normal)   Pulse 80   Ht 5\' 5"  (1.651 m)   Wt 129 lb (58.5 kg)   LMP 11/08/2016   BMI 21.47 kg/m  Body mass index is 21.47 kg/m.  Physical Examination: General appearance - alert, well appearing, and in no distress Pelvic - small amt thin white malodorous d/c, cx appears normal Bimanual- neg CMT, some mild tenderness Lt adnexa, none on Rt, no masses/abnormalities felt  Results for orders placed or performed in visit on 11/15/16 (from the past 24 hour(s))  POCT Wet Prep Lenard Forth [place])   Collection Time: 11/15/16  4:51 PM  Result Value Ref Range   Source Wet Prep POC vaginal    WBC, Wet Prep HPF POC few    Bacteria Wet Prep HPF POC None (A) Few   BACTERIA WET PREP MORPHOLOGY POC     Clue Cells Wet Prep HPF POC Few (A) None   Clue Cells Wet  Prep Whiff POC Positive Whiff    Yeast Wet Prep HPF POC None    KOH Wet Prep POC     Trichomonas Wet Prep HPF POC Absent Absent     Assessment & Plan:  A:   BV  Pelvic pain  P:  Rx metrogel, no sex or etoh while taking  Switch condoms  Return for next week for pelvic u/s and f/u w/ me.  Tawnya Crook CNM, [facility] 11/15/2016 4:51 PM

## 2016-11-22 ENCOUNTER — Other Ambulatory Visit: Payer: Self-pay | Admitting: Women's Health

## 2016-11-22 DIAGNOSIS — Z8742 Personal history of other diseases of the female genital tract: Secondary | ICD-10-CM

## 2016-11-22 DIAGNOSIS — R102 Pelvic and perineal pain: Secondary | ICD-10-CM

## 2016-11-23 ENCOUNTER — Ambulatory Visit (INDEPENDENT_AMBULATORY_CARE_PROVIDER_SITE_OTHER): Payer: Medicaid Other

## 2016-11-23 ENCOUNTER — Ambulatory Visit (INDEPENDENT_AMBULATORY_CARE_PROVIDER_SITE_OTHER): Payer: Medicaid Other | Admitting: Women's Health

## 2016-11-23 ENCOUNTER — Encounter: Payer: Self-pay | Admitting: Women's Health

## 2016-11-23 VITALS — BP 76/60 | HR 80 | Wt 127.0 lb

## 2016-11-23 DIAGNOSIS — N83201 Unspecified ovarian cyst, right side: Secondary | ICD-10-CM | POA: Diagnosis not present

## 2016-11-23 DIAGNOSIS — Z8742 Personal history of other diseases of the female genital tract: Secondary | ICD-10-CM | POA: Diagnosis not present

## 2016-11-23 DIAGNOSIS — N898 Other specified noninflammatory disorders of vagina: Secondary | ICD-10-CM | POA: Diagnosis not present

## 2016-11-23 DIAGNOSIS — R102 Pelvic and perineal pain: Secondary | ICD-10-CM

## 2016-11-23 LAB — POCT WET PREP (WET MOUNT)
CLUE CELLS WET PREP WHIFF POC: NEGATIVE
TRICHOMONAS WET PREP HPF POC: ABSENT

## 2016-11-23 NOTE — Progress Notes (Signed)
PELVIC US TA/TV: homogeneous anteverted uterus,wnl,EEC 10.9 mm,normal left ovary,simple right ovarian cyst 2.6 x 2.2 x 2 cm,small amount of simple cul de sac fluid

## 2016-11-23 NOTE — Progress Notes (Signed)
   [place] [facility] Visit  Patient name: Kayla Nicholson MRN [id number]  Date of birth: [birth year]  CC & HPI:  Kayla Nicholson is a 26 y.o. G50P0101 African American female presenting today for f/u after pelvic u/s. Seen 11/15/16 w/ report of pelvic pain like her h/o ovarian cysts, also dx w/ BV- rx'd metrogel which she has finished now. GC/CT neg. Reports pain improved, more on Lt now. Vaginal dryness. White d/c. No itching/irritation.  Patient's last menstrual period was 11/08/2016. The current method of family planning is condoms. Last pap Jan 09, 2016, neg  Pertinent History Reviewed:  Medical & Surgical Hx:   Past medical, surgical, family, and social history reviewed in electronic medical record Medications: Reviewed & Updated - see associated section Allergies: Reviewed in electronic medical record  Objective Findings:  Vitals: BP (!) 76/60   Pulse 80   Wt 127 lb (57.6 kg)   LMP 11/08/2016   BMI 21.13 kg/m  Body mass index is 21.13 kg/m.  Physical Examination: General appearance - alert, well appearing, and in no distress Pelvic - normal external genitalia, vulva, vagina, cervix, uterus and adnexa, small amt thick white nonodorous d/c, wet prep neg, possible remnant from metrogel  Results for orders placed or performed in visit on 11/23/16 (from the past 24 hour(s))  POCT Wet Prep Len[facility] Forth [place])   Collection Time: 11/23/16  1:20 PM  Result Value Ref Range   Source Wet Prep POC vagina    WBC, Wet Prep HPF POC none    Bacteria Wet Prep HPF POC None (A) Few   BACTERIA WET PREP MORPHOLOGY POC     Clue Cells Wet Prep HPF POC None None   Clue Cells Wet Prep Whiff POC Negative Whiff    Yeast Wet Prep HPF POC None    KOH Wet Prep POC     Trichomonas Wet Prep HPF POC Absent Absent   Today's Pelvic U/S:   Kayla Nicholson is a 26 y.o. G1P0101 LMP 11/08/2016 she is here for a pelvic sonogram for LLQ pain,HX of ovarian cysts.  Uterus                      8.6 x 4.8 x 4.8 cm, vol 103  ml,homogeneous anteverted uterus,wnl  Endometrium          10.9 mm, symmetrical, wnl  Right ovary             4.2 x 2.4 x 2.7 cm, simple right ovarian cyst 2.6 x 2.2 x 2 cm  Left ovary                3.5 x 2.3 x 2.7 cm, wnl  Small amount of simple cul de sac fluid   Technician Comments:   PELVIC US TA/TV: homogeneous anteverted uterus,wnl,EEC 10.9 mm,normal left ovary,simple right ovarian cyst 2.6 x 2.2 x 2 cm,small amount of simple cul de sac fluid,ovaries appear mobile,bilat adnexal pain during ultrasound. Amber Heide Guile 11/23/2016 12:13 PM  Assessment & Plan:  A:   Improving pelvic pain  Resolved BV  Vaginal d/c, normal wet prep  Vaginal dryness  Small Rt simple ovarian cyst  P:  Can try Luvena for vaginal dryness, sample given  Condoms always  Return for July for physical.  Tawnya Crook CNM, [facility] 11/23/2016 1:22 PM

## 2017-02-07 ENCOUNTER — Encounter (HOSPITAL_COMMUNITY): Payer: Self-pay | Admitting: *Deleted

## 2017-02-07 ENCOUNTER — Other Ambulatory Visit: Payer: Self-pay

## 2017-02-07 ENCOUNTER — Emergency Department (HOSPITAL_COMMUNITY)
Admission: EM | Admit: 2017-02-07 | Discharge: 2017-02-07 | Disposition: A | Discharge status: Home / Self Care | Payer: Medicaid Other | Attending: Emergency Medicine | Admitting: Emergency Medicine

## 2017-02-07 DIAGNOSIS — N3001 Acute cystitis with hematuria: Secondary | ICD-10-CM

## 2017-02-07 DIAGNOSIS — A5901 Trichomonal vulvovaginitis: Secondary | ICD-10-CM | POA: Insufficient documentation

## 2017-02-07 DIAGNOSIS — R1032 Left lower quadrant pain: Secondary | ICD-10-CM | POA: Diagnosis present

## 2017-02-07 LAB — URINALYSIS, ROUTINE W REFLEX MICROSCOPIC
Bilirubin Urine: NEGATIVE
Glucose, UA: NEGATIVE mg/dL
Hgb urine dipstick: NEGATIVE
Ketones, ur: NEGATIVE mg/dL
NITRITE: NEGATIVE
PROTEIN: NEGATIVE mg/dL
Specific Gravity, Urine: 1.028 (ref 1.005–1.030)
pH: 6 (ref 5.0–8.0)

## 2017-02-07 LAB — WET PREP, GENITAL
Clue Cells Wet Prep HPF POC: NONE SEEN
Sperm: NONE SEEN
Yeast Wet Prep HPF POC: NONE SEEN

## 2017-02-07 MED ORDER — LIDOCAINE HCL (PF) 1 % IJ SOLN
INTRAMUSCULAR | Status: AC
  Filled 2017-02-07: qty 2

## 2017-02-07 MED ORDER — AZITHROMYCIN 250 MG PO TABS
1000.0000 mg | ORAL_TABLET | Freq: Once | ORAL | Status: AC
  Administered 2017-02-07: 1000 mg via ORAL
  Filled 2017-02-07: qty 4

## 2017-02-07 MED ORDER — ONDANSETRON HCL 4 MG PO TABS: 4.0000 mg | ORAL_TABLET | Freq: Three times a day (TID) | ORAL | 0 refills | Status: DC | PRN

## 2017-02-07 MED ORDER — TINIDAZOLE 500 MG PO TABS: 500.0000 mg | ORAL_TABLET | Freq: Two times a day (BID) | ORAL | 0 refills | Status: AC

## 2017-02-07 MED ORDER — CEPHALEXIN 500 MG PO CAPS: 500.0000 mg | ORAL_CAPSULE | Freq: Two times a day (BID) | ORAL | 0 refills | Status: DC

## 2017-02-07 MED ORDER — CEFTRIAXONE SODIUM 250 MG IJ SOLR
250.0000 mg | Freq: Once | INTRAMUSCULAR | Status: AC
  Administered 2017-02-07: 250 mg via INTRAMUSCULAR
  Filled 2017-02-07: qty 250

## 2017-02-07 NOTE — ED Notes (Signed)
Patient given discharge instruction, verbalized understand. Patient ambulatory out of the department.  

## 2017-02-07 NOTE — ED Notes (Signed)
Pt ambulatory to the bathroom for sample

## 2017-02-07 NOTE — ED Provider Notes (Signed)
[facility] EMERGENCY DEPARTMENT Provider Note   CSN: [id number] Arrival date & time: 02/07/17  2004     History   Chief Complaint Chief Complaint  Patient presents with  . Abdominal Pain    HPI Kayla Nicholson is a 26 y.o. female.  HPI Patient presents to the emergency room for evaluation of left lower quadrant pain.  Patient states she has had mild nagging pain in the left lower quadrant for the last 3 days.  She has noticed some vaginal discharge.  She denies any trouble with dysuria.  She denies any vaginal bleeding.  Patient's not had any nausea or vomiting.  She has had any occasional loose stool but no frequent diarrhea.  Patient has a history of ovarian cysts.  She decided to come and get it checked out this evening because her symptoms have persisted. Past Medical History:  Diagnosis Date  . Back pain   . BV (bacterial vaginosis) 12/22/2013  . Contraceptive management 02/18/2014  . Irregular bleeding 07/27/2014  . LLQ discomfort 07/27/2014  . Urinary frequency 07/27/2014  . Yeast infection 04/27/2014    Patient Active Problem List   Diagnosis Date Noted  . Right ovarian cyst 11/23/2016  . Irregular bleeding 07/27/2014  . Yeast infection 04/27/2014  . BV (bacterial vaginosis) 12/22/2013    Past Surgical History:  Procedure Laterality Date  . LAPAROSCOPIC OVARIAN CYSTECTOMY Right 08/25/2014   Procedure: LAPAROSCOPIC OVARIAN CYSTECTOMY;  Surgeon: Florian Buff, MD;  Location: [facility];  Service: Gynecology;  Laterality: Right;    OB History    Gravida Para Term Preterm AB Living   1 1   1   1    SAB TAB Ectopic Multiple Live Births           1       Home Medications    Prior to Admission medications   Medication Sig Start Date End Date Taking? Authorizing Provider  cephALEXin (KEFLEX) 500 MG capsule Take 1 capsule (500 mg total) by mouth 2 (two) times daily. 02/07/17   Dorie Rank, MD  ibuprofen (ADVIL,MOTRIN) 800 MG tablet Take 1 tablet (800 mg total) by mouth  3 (three) times daily. Patient not taking: Reported on 11/23/2016 11/06/16   Orpah Greek, MD  metroNIDAZOLE (METROGEL VAGINAL) 0.75 % vaginal gel Place 1 Applicatorful vaginally at bedtime. X 5 nights, no alcohol or sex while using Patient not taking: Reported on 11/23/2016 11/15/16   Roma Schanz, CNM  ondansetron (ZOFRAN) 4 MG tablet Take 1 tablet (4 mg total) by mouth every 8 (eight) hours as needed for nausea or vomiting. 02/07/17   Dorie Rank, MD  tinidazole (TINDAMAX) 500 MG tablet Take 1 tablet (500 mg total) by mouth 2 (two) times daily for 7 days. 02/07/17 02/14/17  Dorie Rank, MD  traMADol (ULTRAM) 50 MG tablet Take 1 tablet (50 mg total) by mouth every 6 (six) hours as needed. Patient not taking: Reported on 11/15/2016 11/06/16   Orpah Greek, MD    Family History Family History  Problem Relation Age of Onset  . Hypertension Mother   . Ovarian cysts Sister   . Hypertension Maternal Grandmother   . Diabetes Maternal Grandmother   . Ovarian cysts Maternal Grandmother   . Hernia Daughter     Social History Social History   Tobacco Use  . Smoking status: Never Smoker  . Smokeless tobacco: Never Used  Substance Use Topics  . Alcohol use: Yes    Comment: occassional  .  Drug use: No     Allergies   Bee venom; Macrobid [nitrofurantoin monohyd macro]; and Metronidazole   Review of Systems Review of Systems  All other systems reviewed and are negative.    Physical Exam Updated Vital Signs BP 107/62   Pulse 68   Temp 98.3 F (36.8 C) (Oral)   Resp 16   Ht 1.676 m (5\' 6" )   Wt 58.1 kg (128 lb)   LMP 01/28/2017   SpO2 99%   BMI 20.66 kg/m   Physical Exam  Constitutional: She appears well-developed and well-nourished. No distress.  HENT:  Head: Normocephalic and atraumatic.  Right Ear: External ear normal.  Left Ear: External ear normal.  Eyes: Conjunctivae are normal. Right eye exhibits no discharge. Left eye exhibits no discharge. No  scleral icterus.  Neck: Neck supple. No tracheal deviation present.  Cardiovascular: Normal rate, regular rhythm and intact distal pulses.  Pulmonary/Chest: Effort normal and breath sounds normal. No stridor. No respiratory distress. She has no wheezes. She has no rales.  Abdominal: Soft. Bowel sounds are normal. She exhibits no distension. There is no tenderness. There is no rebound and no guarding.  Genitourinary: Uterus normal. Pelvic exam was performed with patient supine. There is no rash, tenderness or lesion on the right labia. There is no rash, tenderness or lesion on the left labia. Cervix exhibits no motion tenderness and no discharge. Right adnexum displays no mass, no tenderness and no fullness. Left adnexum displays no mass, no tenderness and no fullness. Vaginal discharge (light brown) found.  Musculoskeletal: She exhibits no edema or tenderness.  Neurological: She is alert. She has normal strength. No cranial nerve deficit (no facial droop, extraocular movements intact, no slurred speech) or sensory deficit. She exhibits normal muscle tone. She displays no seizure activity. Coordination normal.  Skin: Skin is warm and dry. No rash noted.  Psychiatric: She has a normal mood and affect.  Nursing note and vitals reviewed.    ED Treatments / Results  Labs (all labs ordered are listed, but only abnormal results are displayed) Labs Reviewed  WET PREP, GENITAL - Abnormal; Notable for the following components:      Result Value   Trich, Wet Prep PRESENT (*)    WBC, Wet Prep HPF POC TOO NUMEROUS TO COUNT (*)    All other components within normal limits  URINALYSIS, ROUTINE W REFLEX MICROSCOPIC - Abnormal; Notable for the following components:   APPearance HAZY (*)    Leukocytes, UA LARGE (*)    Bacteria, UA RARE (*)    Squamous Epithelial / LPF 0-5 (*)    All other components within normal limits  URINE CULTURE  RPR  HIV ANTIBODY (ROUTINE TESTING)  GC/CHLAMYDIA PROBE AMP ([facility]) NOT AT [facility]     Radiology No results found.  Procedures Procedures (including critical care time)  Medications Ordered in ED Medications  cefTRIAXone (ROCEPHIN) injection 250 mg (not administered)  azithromycin (ZITHROMAX) tablet 1,000 mg (not administered)     Initial Impression / Assessment and Plan / ED Course  I have reviewed the triage vital signs and the nursing notes.  Pertinent labs & imaging results that were available during my care of the patient were reviewed by me and considered in my medical decision making (see chart for details).   Patient's wet prep is positive for trichomonas.  Patient's urinalysis also is suggestive of a urinary tract infection.  Patient has a metronidazole allergy.  It actually causes nausea and vomiting so  this is more of an intolerance.  I will try her on tinidazole and try twice daily dosing which may reduce her nausea.  Discussed outpatient follow-up with her OB/GYN doctor..  Final Clinical Impressions(s) / ED Diagnoses   Final diagnoses:  Acute cystitis with hematuria  Trichomonal vaginitis    ED Discharge Orders        Ordered    cephALEXin (KEFLEX) 500 MG capsule  2 times daily     02/07/17 2124    tinidazole (TINDAMAX) 500 MG tablet  2 times daily     02/07/17 2124    ondansetron (ZOFRAN) 4 MG tablet  Every 8 hours PRN     02/07/17 2124       Dorie Rank, MD 02/07/17 2127

## 2017-02-07 NOTE — Discharge Instructions (Signed)
The medications as prescribed, follow-up with your OB/GYN doctor.  Zofran medication is to take in case the tinidazole causes any nausea for you.  Your partner should follow up with his doctor or the health department

## 2017-02-07 NOTE — ED Triage Notes (Signed)
Pt c/o left lower abd pain for the past few days,

## 2017-02-09 LAB — HIV ANTIBODY (ROUTINE TESTING W REFLEX): HIV Screen 4th Generation wRfx: NONREACTIVE

## 2017-02-09 LAB — URINE CULTURE: Culture: NO GROWTH

## 2017-02-09 LAB — RPR: RPR: NONREACTIVE

## 2017-02-11 LAB — GC/CHLAMYDIA PROBE AMP (~~LOC~~) NOT AT ARMC
CHLAMYDIA, DNA PROBE: NEGATIVE
Neisseria Gonorrhea: NEGATIVE

## 2017-03-01 ENCOUNTER — Ambulatory Visit: Payer: Medicaid Other | Admitting: Women's Health

## 2017-03-08 ENCOUNTER — Encounter: Payer: Self-pay | Admitting: Obstetrics & Gynecology

## 2017-03-08 ENCOUNTER — Ambulatory Visit: Payer: Medicaid Other | Admitting: Obstetrics & Gynecology

## 2017-03-08 VITALS — BP 110/60 | HR 105 | Ht 65.0 in | Wt 129.5 lb

## 2017-03-08 DIAGNOSIS — R350 Frequency of micturition: Secondary | ICD-10-CM

## 2017-03-08 DIAGNOSIS — A599 Trichomoniasis, unspecified: Secondary | ICD-10-CM | POA: Diagnosis not present

## 2017-03-08 DIAGNOSIS — Z09 Encounter for follow-up examination after completed treatment for conditions other than malignant neoplasm: Secondary | ICD-10-CM | POA: Diagnosis not present

## 2017-03-08 LAB — POCT URINALYSIS DIPSTICK
Blood, UA: NEGATIVE
GLUCOSE UA: NEGATIVE
Ketones, UA: NEGATIVE
LEUKOCYTES UA: NEGATIVE
NITRITE UA: NEGATIVE
PROTEIN UA: NEGATIVE

## 2017-03-08 NOTE — Progress Notes (Signed)
       Chief Complaint  Patient presents with  . POC for trich    urinary urgency    Blood pressure 110/60, pulse (!) 105, height 5\' 5"  (1.651 m), weight 129 lb 8 oz (58.7 kg), last menstrual period 02/24/2017.  26 y.o. G1P0101 Patient's last menstrual period was 02/24/2017. The current method of family planning is none.  Subjective Vaginal discharge for 0 days Itching no Irritation no Odor no Similar to previous no  Previous treatment flagyl BID x 7 days  Objective Vulva:  normal appearing vulva with no masses, tenderness or lesions Vagina:  normal mucosa, no discharge Cervix:  no lesions Uterus:   Adnexa: ovaries:,       Pertinent ROS   Labs or studies Wet Prep:   A sample of vaginal discharge was obtained from the posterior fornix using a cotton swab. 2 drops of saline were placed on a slide and the cotton swab was immersed in the saline. Microscopic evaluation was performed and results were as follows:  Negative  for yeast  Negative for clue cells , consistent with Bacterial vaginosis Negative for trichomonas  Normal WBC population   Whiff test: Negative     Impression Diagnoses this Encounter::   ICD-10-CM   1. Trichomonas vaginalis infection A59.9    resolved  2. Urinary frequency R35.0 POCT urinalysis dipstick    Established relevant diagnosis(es):   Plan/Recommendations: No orders of the defined types were placed in this encounter.   Labs or Scans Ordered: Orders Placed This Encounter  Procedures  . POCT urinalysis dipstick    Management:: No evidence of trichomonas Pt states her partner has been treated as well  Follow up prn  Follow up Return if symptoms worsen or fail to improve.       All questions were answered.

## 2017-04-11 ENCOUNTER — Telehealth: Payer: Self-pay | Admitting: Obstetrics & Gynecology

## 2017-04-11 NOTE — Telephone Encounter (Signed)
Pt called asking what STD screenings had been done when she came in November. Informed pt of labs ran. She states that her boyfriend cheated on her and now she has a "bump" on her vagina. She states that she would like to make sure that its not HSV even though she doesn't think it is. Informed pt that she would need an appt for that.

## 2017-04-11 NOTE — Telephone Encounter (Signed)
Patient called stating that she had some blood work done and she wants to know if a certain test was done, because she has a bump and she know her boyfriend cheated on her and wants to make sure its nothing. Please contact pt

## 2017-04-12 ENCOUNTER — Encounter (HOSPITAL_COMMUNITY): Payer: Self-pay | Admitting: Emergency Medicine

## 2017-04-12 ENCOUNTER — Emergency Department (HOSPITAL_COMMUNITY)
Admission: EM | Admit: 2017-04-12 | Discharge: 2017-04-12 | Disposition: A | Discharge status: Home / Self Care | Payer: Medicaid Other | Attending: Emergency Medicine | Admitting: Emergency Medicine

## 2017-04-12 ENCOUNTER — Other Ambulatory Visit: Payer: Self-pay

## 2017-04-12 DIAGNOSIS — R509 Fever, unspecified: Secondary | ICD-10-CM | POA: Diagnosis not present

## 2017-04-12 DIAGNOSIS — R112 Nausea with vomiting, unspecified: Secondary | ICD-10-CM | POA: Insufficient documentation

## 2017-04-12 DIAGNOSIS — K1379 Other lesions of oral mucosa: Secondary | ICD-10-CM | POA: Diagnosis not present

## 2017-04-12 DIAGNOSIS — R197 Diarrhea, unspecified: Secondary | ICD-10-CM | POA: Diagnosis not present

## 2017-04-12 DIAGNOSIS — J029 Acute pharyngitis, unspecified: Secondary | ICD-10-CM | POA: Insufficient documentation

## 2017-04-12 NOTE — ED Notes (Signed)
Pt not in room.

## 2017-04-12 NOTE — ED Triage Notes (Signed)
Patient reports nausea, vomiting, diarrhea, fever, and lower abd x1 that started yesterday. Patient states fever was 99.7, denies taking any medication. Per patient denies any nausea, vomiting, diarrhea, or abd pain since yesterday. Patient states started shortly after bump appeared on bottom of lip.

## 2017-04-12 NOTE — Discharge Instructions (Addendum)
You were seen in the emergency department for your viral symptoms.   Reasons to return to care would be if you have shortness of breath that is not resolving, or if you are unable to stay hydrated by mouth.  Please schedule follow up to be seen by your regular doctor within the next 5-7 days.

## 2017-04-12 NOTE — ED Provider Notes (Signed)
[facility] EMERGENCY DEPARTMENT Provider Note   CSN: [id number] Arrival date & time: 04/12/17  1004     History   Chief Complaint Chief Complaint  Patient presents with  . Emesis    HPI Kayla Nicholson is a 27 y.o. female presenting for nausea and vomiting as well as a bump on her lip.  She reports one week of subjective fevers, sore throat and nausea with one episode of vomiting after she ate an entire cheesecake. She also has had a few episodes of watery diarrhea. Her main concern is that she has a bump below the buccal mucosa of her lower lip. She reports that her boyfriend cheated on her and she is concerned about sexually transmitted infection such as herpes. She has never had prior lesions. She has not noticed any sores inside her mouth.  She denies dysuria, denies rhinorrhea or congestion. She reports she has been able to stay hydrated and take fluid by mouth.  Past Medical History:  Diagnosis Date  . Back pain   . BV (bacterial vaginosis) 12/22/2013  . Contraceptive management 02/18/2014  . Irregular bleeding 07/27/2014  . LLQ discomfort 07/27/2014  . Trichimoniasis   . Urinary frequency 07/27/2014  . Yeast infection 04/27/2014    Patient Active Problem List   Diagnosis Date Noted  . Right ovarian cyst 11/23/2016  . Irregular bleeding 07/27/2014  . Yeast infection 04/27/2014  . BV (bacterial vaginosis) 12/22/2013    Past Surgical History:  Procedure Laterality Date  . LAPAROSCOPIC OVARIAN CYSTECTOMY Right 08/25/2014   Procedure: LAPAROSCOPIC OVARIAN CYSTECTOMY;  Surgeon: Florian Buff, MD;  Location: [facility];  Service: Gynecology;  Laterality: Right;    OB History    Gravida Para Term Preterm AB Living   1 1   1   1    SAB TAB Ectopic Multiple Live Births           1      Home Medications    Prior to Admission medications   Not on File    Family History Family History  Problem Relation Age of Onset  . Hypertension Mother   . Ovarian cysts Sister   .  Hypertension Maternal Grandmother   . Diabetes Maternal Grandmother   . Ovarian cysts Maternal Grandmother   . Hernia Daughter     Social History Social History   Tobacco Use  . Smoking status: Never Smoker  . Smokeless tobacco: Never Used  Substance Use Topics  . Alcohol use: Yes    Comment: occassional  . Drug use: No     Allergies   Bee venom; Macrobid [nitrofurantoin monohyd macro]; and Metronidazole   Review of Systems Review of Systems    Physical Exam Updated Vital Signs BP 119/69 (BP Location: Right Arm)   Pulse 65   Temp 99.1 F (37.3 C) (Oral)   Resp 18   Ht 5\' 5"  (1.651 m)   Wt 58.1 kg (128 lb)   LMP 03/21/2016   SpO2 100%   BMI 21.30 kg/m   Physical Exam  Constitutional: She is oriented to person, place, and time. She appears well-developed and well-nourished.  HENT:  Head: Normocephalic and atraumatic.  Mouth/Throat: Oropharynx is clear and moist. No oropharyngeal exudate.  Eyes: Conjunctivae and EOM are normal.  Neck: Neck supple.  Cardiovascular: Normal rate and regular rhythm. Exam reveals no gallop and no friction rub.  No murmur heard. Pulmonary/Chest: Effort normal and breath sounds normal. She has no wheezes. She has no rales.  She exhibits no tenderness.  Abdominal: Soft. She exhibits no distension. There is no tenderness. There is no guarding.  Neurological: She is alert and oriented to person, place, and time.  Skin: Skin is warm and dry. Capillary refill takes less than 2 seconds.  +Small <1cm lesion noted below vermilion border of the lower lip, no surrounding erythema or edema. Lesion is open, no fluctuance to drain.    ED Treatments / Results  Labs (all labs ordered are listed, but only abnormal results are displayed) Labs Reviewed - No data to display  EKG  EKG Interpretation None       Radiology No results found.  Procedures Procedures (including critical care time)  Medications Ordered in ED Medications - No data  to display   Initial Impression / Assessment and Plan / ED Course  I have reviewed the triage vital signs and the nursing notes.  Pertinent labs & imaging results that were available during my care of the patient were reviewed by me and considered in my medical decision making (see chart for details).    27 yo F presenting with nausea and vomiting most consistent with a viral illness. She is maintaining hydration and appears very well and stable on exam. Her oral lesion was discussed with her, no need to test for HSV as this is a very common finding that should resolve on its own.  Patient is agreeable to this plan. Return precautions were provided.  Final Clinical Impressions(s) / ED Diagnoses   Final diagnoses:  Nausea vomiting and diarrhea    ED Discharge Orders    None       Everrett Coombe, MD 04/12/17 1245    Elnora Morrison, MD 04/12/17 1600

## 2017-04-17 ENCOUNTER — Ambulatory Visit (INDEPENDENT_AMBULATORY_CARE_PROVIDER_SITE_OTHER): Payer: Medicaid Other | Admitting: Advanced Practice Midwife

## 2017-04-17 ENCOUNTER — Encounter: Payer: Self-pay | Admitting: Advanced Practice Midwife

## 2017-04-17 VITALS — BP 100/56 | HR 78 | Ht 65.0 in | Wt 133.0 lb

## 2017-04-17 DIAGNOSIS — R350 Frequency of micturition: Secondary | ICD-10-CM

## 2017-04-17 DIAGNOSIS — Z3202 Encounter for pregnancy test, result negative: Secondary | ICD-10-CM

## 2017-04-17 DIAGNOSIS — N3001 Acute cystitis with hematuria: Secondary | ICD-10-CM | POA: Diagnosis not present

## 2017-04-17 LAB — POCT URINALYSIS DIPSTICK
GLUCOSE UA: NEGATIVE
NITRITE UA: NEGATIVE

## 2017-04-17 LAB — POCT URINE PREGNANCY: Preg Test, Ur: NEGATIVE

## 2017-04-17 MED ORDER — PHENAZOPYRIDINE HCL 200 MG PO TABS: 200.0000 mg | ORAL_TABLET | Freq: Three times a day (TID) | ORAL | 0 refills | Status: DC | PRN

## 2017-04-17 MED ORDER — SULFAMETHOXAZOLE-TRIMETHOPRIM 800-160 MG PO TABS: 1.0000 | ORAL_TABLET | Freq: Two times a day (BID) | ORAL | 0 refills | Status: DC

## 2017-04-17 NOTE — Progress Notes (Signed)
[facility] Visit  Patient name: Kayla Nicholson MRN [id number]  Date of birth: 04/09/1990  CC & HPI:  Kayla BLAYKLEE MABLE is a 27 y.o. Asian female presenting today for frequency and dysuria for a few days.   Pertinent History Reviewed:  Medical & Surgical Hx:   Past Medical History:  Diagnosis Date  . Back pain   . BV (bacterial vaginosis) 12/22/2013  . Contraceptive management 02/18/2014  . Irregular bleeding 07/27/2014  . LLQ discomfort 07/27/2014  . Trichimoniasis   . Urinary frequency 07/27/2014  . Yeast infection 04/27/2014   Past Surgical History:  Procedure Laterality Date  . LAPAROSCOPIC OVARIAN CYSTECTOMY Right 08/25/2014   Procedure: LAPAROSCOPIC OVARIAN CYSTECTOMY;  Surgeon: Florian Buff, MD;  Location: [facility];  Service: Gynecology;  Laterality: Right;   Family History  Problem Relation Age of Onset  . Hypertension Mother   . Ovarian cysts Sister   . Hypertension Maternal Grandmother   . Diabetes Maternal Grandmother   . Ovarian cysts Maternal Grandmother   . Hernia Daughter     Current Outpatient Medications:  .  phenazopyridine (PYRIDIUM) 200 MG tablet, Take 1 tablet (200 mg total) by mouth 3 (three) times daily as needed for pain., Disp: 10 tablet, Rfl: 0 .  sulfamethoxazole-trimethoprim (BACTRIM DS,SEPTRA DS) 800-160 MG tablet, Take 1 tablet by mouth 2 (two) times daily., Disp: 10 tablet, Rfl: 0 Social History: Reviewed -  reports that  has never smoked. she has never used smokeless tobacco.  Review of Systems:   Constitutional: Negative for fever and chills Eyes: Negative for visual disturbances Respiratory: Negative for shortness of breath, dyspnea Cardiovascular: Negative for chest pain or palpitations  Gastrointestinal: Negative for vomiting, diarrhea and constipation; no abdominal pain Genitourinary: Negative for vaginal irritation or itching Musculoskeletal: Negative for back pain, joint pain, myalgias  Neurological: Negative for dizziness and  headaches    Objective Findings:    Physical Examination: General appearance - well appearing, and in no distress Mental status - alert, oriented to person, place, and time Chest:  Normal respiratory effort Heart - normal rate and regular rhythm Abdomen:  Soft, nontender Pelvic: deferred Musculoskeletal:  Normal range of motion without pain Extremities:  No edema    Results for orders placed or performed in visit on 04/17/17 (from the past 24 hour(s))  POCT urine pregnancy   Collection Time: 04/17/17  4:23 PM  Result Value Ref Range   Preg Test, Ur Negative Negative  POCT Urinalysis Dipstick   Collection Time: 04/17/17  4:24 PM  Result Value Ref Range   Color, UA     Clarity, UA     Glucose, UA neg    Bilirubin, UA     Ketones, UA moderate    Spec Grav, UA  1.010 - 1.025   Blood, UA small    pH, UA  5.0 - 8.0   Protein, UA trace    Urobilinogen, UA  0.2 or 1.0 E.U./dL   Nitrite, UA neg    Leukocytes, UA Large (3+) (A) Negative   Appearance     Odor        Assessment & Plan:  A:   Probable UTI P:  rx septra and pyridiium    No Follow-up on file.  CRESENZO-DISHMAN,Khamiya Varin CNM 04/17/2017 4:52 PM

## 2017-04-19 ENCOUNTER — Telehealth: Payer: Self-pay | Admitting: *Deleted

## 2017-04-19 LAB — URINE CULTURE: ORGANISM ID, BACTERIA: NO GROWTH

## 2017-04-19 MED ORDER — FLUCONAZOLE 150 MG PO TABS: ORAL_TABLET | ORAL | 1 refills | Status: DC

## 2017-04-19 NOTE — Telephone Encounter (Signed)
Pt aware Diflucan sent to [facility]

## 2017-04-19 NOTE — Telephone Encounter (Signed)
Patient requesting Diflucan for yeast infection. She was prescribed Bactrim for UTI on 1/30. Please advise.

## 2017-06-07 ENCOUNTER — Encounter (HOSPITAL_COMMUNITY): Payer: Self-pay | Admitting: Cardiology

## 2017-06-07 ENCOUNTER — Telehealth: Payer: Self-pay | Admitting: *Deleted

## 2017-06-07 ENCOUNTER — Emergency Department (HOSPITAL_COMMUNITY)
Admission: EM | Admit: 2017-06-07 | Discharge: 2017-06-07 | Disposition: A | Discharge status: Home / Self Care | Payer: Medicaid Other | Attending: Emergency Medicine | Admitting: Emergency Medicine

## 2017-06-07 DIAGNOSIS — R300 Dysuria: Secondary | ICD-10-CM | POA: Diagnosis present

## 2017-06-07 DIAGNOSIS — N760 Acute vaginitis: Secondary | ICD-10-CM | POA: Diagnosis not present

## 2017-06-07 DIAGNOSIS — N72 Inflammatory disease of cervix uteri: Secondary | ICD-10-CM | POA: Diagnosis not present

## 2017-06-07 LAB — WET PREP, GENITAL
Sperm: NONE SEEN
TRICH WET PREP: NONE SEEN
Yeast Wet Prep HPF POC: NONE SEEN

## 2017-06-07 LAB — URINALYSIS, ROUTINE W REFLEX MICROSCOPIC
Bacteria, UA: NONE SEEN
Bilirubin Urine: NEGATIVE
GLUCOSE, UA: NEGATIVE mg/dL
HGB URINE DIPSTICK: NEGATIVE
Ketones, ur: NEGATIVE mg/dL
Nitrite: NEGATIVE
PROTEIN: NEGATIVE mg/dL
Specific Gravity, Urine: 1.024 (ref 1.005–1.030)
pH: 5 (ref 5.0–8.0)

## 2017-06-07 LAB — POC URINE PREG, ED: Preg Test, Ur: NEGATIVE

## 2017-06-07 MED ORDER — METRONIDAZOLE 0.75 % VA GEL: 1.0000 | Freq: Every day | VAGINAL | 1 refills | Status: DC

## 2017-06-07 MED ORDER — CEFTRIAXONE SODIUM 250 MG IJ SOLR
250.0000 mg | Freq: Once | INTRAMUSCULAR | Status: AC
  Administered 2017-06-07: 250 mg via INTRAMUSCULAR
  Filled 2017-06-07: qty 250

## 2017-06-07 MED ORDER — AZITHROMYCIN 250 MG PO TABS
1000.0000 mg | ORAL_TABLET | Freq: Once | ORAL | Status: AC
Start: 2017-06-07 — End: 2017-06-07
  Administered 2017-06-07: 1000 mg via ORAL
  Filled 2017-06-07: qty 4

## 2017-06-07 MED ORDER — METRONIDAZOLE 0.75 % VA GEL: 1.0000 | Freq: Two times a day (BID) | VAGINAL | 0 refills | Status: DC

## 2017-06-07 MED ORDER — LIDOCAINE HCL (PF) 1 % IJ SOLN
INTRAMUSCULAR | Status: AC
  Administered 2017-06-07: 2 mL
  Filled 2017-06-07: qty 2

## 2017-06-07 NOTE — ED Triage Notes (Signed)
RLQ abdominal pain since Monday.  Urinary frequency.

## 2017-06-07 NOTE — Discharge Instructions (Signed)
Use a condom each time that you have sex.  [facility] if not feeling better in 1 or 2 days

## 2017-06-07 NOTE — Telephone Encounter (Signed)
Pt does not have voicemail, will rx metrogel

## 2017-06-07 NOTE — ED Provider Notes (Addendum)
[facility] EMERGENCY DEPARTMENT Provider Note   CSN: [id number] Arrival date & time: 06/07/17  0354     History   Chief Complaint No chief complaint on file.   HPI Kayla Nicholson is a 27 y.o. female.  HPI Complains of burning on urination at urethral meatus which started 4 days ago.  Also complained of right lower quadrant pain which started 4 days ago which it was initially constant and now intermittent lasting only a split second at a time.  Denies vaginal discharge.  No fever no nausea or vomiting no anorexia.  She treated herself with Azo without relief.  She is presently asymptomatic.  Other associated symptoms include urinary urgency, without frequency.  This feels like a urinary tract infection that she had in the past.  No other associated symptoms Past Medical History:  Diagnosis Date  . Back pain   . BV (bacterial vaginosis) 12/22/2013  . Contraceptive management 02/18/2014  . Irregular bleeding 07/27/2014  . LLQ discomfort 07/27/2014  . Trichimoniasis   . Urinary frequency 07/27/2014  . Yeast infection 04/27/2014    Patient Active Problem List   Diagnosis Date Noted  . Right ovarian cyst 11/23/2016  . Irregular bleeding 07/27/2014  . Yeast infection 04/27/2014  . BV (bacterial vaginosis) 12/22/2013    Past Surgical History:  Procedure Laterality Date  . LAPAROSCOPIC OVARIAN CYSTECTOMY Right 08/25/2014   Procedure: LAPAROSCOPIC OVARIAN CYSTECTOMY;  Surgeon: Florian Buff, MD;  Location: [facility];  Service: Gynecology;  Laterality: Right;    OB History    Gravida  1   Para  1   Term      Preterm  1   AB      Living  1     SAB      TAB      Ectopic      Multiple      Live Births  1            Home Medications    Prior to Admission medications   Medication Sig Start Date End Date Taking? Authorizing Provider  fluconazole (DIFLUCAN) 150 MG tablet Take 1 now and 1 in 3 days 04/19/17   Estill Dooms, NP  phenazopyridine (PYRIDIUM) 200 MG  tablet Take 1 tablet (200 mg total) by mouth 3 (three) times daily as needed for pain. 04/17/17   Cresenzo-Dishmon, Joaquim Lai, CNM  sulfamethoxazole-trimethoprim (BACTRIM DS,SEPTRA DS) 800-160 MG tablet Take 1 tablet by mouth 2 (two) times daily. 04/17/17   Christin Fudge, CNM    Family History Family History  Problem Relation Age of Onset  . Hypertension Mother   . Ovarian cysts Sister   . Hypertension Maternal Grandmother   . Diabetes Maternal Grandmother   . Ovarian cysts Maternal Grandmother   . Hernia Daughter     Social History Social History   Tobacco Use  . Smoking status: Never Smoker  . Smokeless tobacco: Never Used  Substance Use Topics  . Alcohol use: Yes    Comment: occassional  . Drug use: No     Allergies   Bee venom; Macrobid [nitrofurantoin monohyd macro]; and Metronidazole  Metronidazole causes nausea Review of Systems Review of Systems  Constitutional: Negative.   HENT: Negative.   Respiratory: Negative.   Cardiovascular: Negative.   Gastrointestinal: Positive for abdominal pain.  Genitourinary: Positive for dysuria and urgency.  Musculoskeletal: Negative.   Skin: Negative.   Neurological: Negative.   Psychiatric/Behavioral: Negative.   All other systems reviewed and are  negative.    Physical Exam Updated Vital Signs BP 106/88   Pulse 98   Temp 98.3 F (36.8 C) (Oral)   Resp 16   Ht 5\' 5"  (1.651 m)   Wt 59.4 kg (131 lb)   LMP 05/19/2017   SpO2 100%   BMI 21.80 kg/m   Physical Exam  Constitutional: She appears well-developed and well-nourished.  HENT:  Head: Normocephalic and atraumatic.  Eyes: Pupils are equal, round, and reactive to light. Conjunctivae are normal.  Neck: Neck supple. No tracheal deviation present. No thyromegaly present.  Cardiovascular: Normal rate and regular rhythm.  No murmur heard. Pulmonary/Chest: Effort normal and breath sounds normal.  Abdominal: Soft. Bowel sounds are normal. She exhibits no  distension. There is no tenderness.  Genitourinary:  Genitourinary Comments: Pelvic exam no external lesion copious yellowish green thick vaginal discharge.  Cervical loss closed.  Positive cervical motion tenderness.  Cervix is friable.  No adnexal masses or tenderness  Musculoskeletal: Normal range of motion. She exhibits no edema or tenderness.  Neurological: She is alert. Coordination normal.  Skin: Skin is warm and dry. No rash noted.  Psychiatric: She has a normal mood and affect.  Nursing note and vitals reviewed.    ED Treatments / Results  Labs (all labs ordered are listed, but only abnormal results are displayed) Labs Reviewed  URINALYSIS, ROUTINE W REFLEX MICROSCOPIC  POC URINE PREG, ED    EKG  EKG Interpretation None      Results for orders placed or performed during the hospital encounter of 06/07/17  Wet prep, genital  Result Value Ref Range   Yeast Wet Prep HPF POC NONE SEEN NONE SEEN   Trich, Wet Prep NONE SEEN NONE SEEN   Clue Cells Wet Prep HPF POC PRESENT (A) NONE SEEN   WBC, Wet Prep HPF POC MANY (A) NONE SEEN   Sperm NONE SEEN   Urinalysis, Routine w reflex microscopic  Result Value Ref Range   Color, Urine YELLOW YELLOW   APPearance HAZY (A) CLEAR   Specific Gravity, Urine 1.024 1.005 - 1.030   pH 5.0 5.0 - 8.0   Glucose, UA NEGATIVE NEGATIVE mg/dL   Hgb urine dipstick NEGATIVE NEGATIVE   Bilirubin Urine NEGATIVE NEGATIVE   Ketones, ur NEGATIVE NEGATIVE mg/dL   Protein, ur NEGATIVE NEGATIVE mg/dL   Nitrite NEGATIVE NEGATIVE   Leukocytes, UA MODERATE (A) NEGATIVE   RBC / HPF 0-5 0 - 5 RBC/hpf   WBC, UA 0-5 0 - 5 WBC/hpf   Bacteria, UA NONE SEEN NONE SEEN   Squamous Epithelial / LPF 0-5 (A) NONE SEEN   Mucus PRESENT   POC Urine Pregnancy, ED (do NOT order at [facility])  Result Value Ref Range   Preg Test, Ur NEGATIVE NEGATIVE   No results found. Radiology No results found.  Procedures Procedures (including critical care time)  Medications  Ordered in ED Medications - No data to display   Initial Impression / Assessment and Plan / ED Course  I have reviewed the triage vital signs and the nursing notes.  Pertinent labs & imaging results that were available during my care of the patient were reviewed by me and considered in my medical decision making (see chart for details).     Conically patient with cervicitis.  Rocephin and Zithromax ordered Wet prep consistent with vaginitis.  Plan safe sex encouraged.  Prescription Metro[facility]el [facility] Final Clinical Impressions(s) / ED Diagnoses  Dx #1 cervicitis #2 vaginitis Final diagnoses:  None    ED Discharge Orders    None       Orlie Dakin, MD 06/07/17 [place], MD 06/07/17 1053

## 2017-06-07 NOTE — Telephone Encounter (Signed)
Patient states she thinks she has BV, foul odor discharge with a color to it and some itching. She is requesting medication. Please advise.

## 2017-06-08 LAB — HIV ANTIBODY (ROUTINE TESTING W REFLEX): HIV Screen 4th Generation wRfx: NONREACTIVE

## 2017-06-08 LAB — RPR: RPR: NONREACTIVE

## 2017-06-10 LAB — GC/CHLAMYDIA PROBE AMP (~~LOC~~) NOT AT ARMC
CHLAMYDIA, DNA PROBE: NEGATIVE
NEISSERIA GONORRHEA: NEGATIVE

## 2017-07-31 ENCOUNTER — Encounter: Payer: Self-pay | Admitting: Adult Health

## 2017-07-31 ENCOUNTER — Ambulatory Visit: Payer: Medicaid Other | Admitting: Adult Health

## 2017-07-31 VITALS — BP 100/60 | HR 98 | Ht 65.0 in | Wt 139.5 lb

## 2017-07-31 DIAGNOSIS — R300 Dysuria: Secondary | ICD-10-CM | POA: Diagnosis not present

## 2017-07-31 DIAGNOSIS — R1032 Left lower quadrant pain: Secondary | ICD-10-CM

## 2017-07-31 LAB — POCT URINALYSIS DIPSTICK
Blood, UA: NEGATIVE
Glucose, UA: NEGATIVE
NITRITE UA: NEGATIVE

## 2017-07-31 MED ORDER — SULFAMETHOXAZOLE-TRIMETHOPRIM 800-160 MG PO TABS: 1.0000 | ORAL_TABLET | Freq: Two times a day (BID) | ORAL | 0 refills | Status: DC

## 2017-07-31 NOTE — Progress Notes (Signed)
  Subjective:     [name], female   DOB: Jan 27, 1991, 27 y.o.   MRN: [id number]  HPI Kayla is a 27 year old black female in complaining of burning with urination and pain in left side at times. No new partners was treated for BV and cervicitis in [place], had negative GC/CHL then.   Review of Systems Burning with urination and pain in left side at times Reviewed past medical,surgical, social and family history. Reviewed medications and allergies.     Objective:   Physical Exam BP 100/60 (BP Location: Left Arm, Patient Position: Sitting, Cuff Size: Normal)   Pulse 98   Ht 5\' 5"  (1.651 m)   Wt 139 lb 8 oz (63.3 kg)   LMP 07/18/2017   BMI 23.21 kg/m urine dipstick trace leuks,1+protein,1+ketones.Skin warm and dry.Pelvic: external genitalia is normal in appearance no lesions, vagina: white discharge without odor,urethra has no lesions or masses noted, cervix: no CMT,bulbous, uterus: normal size, shape and contour, non tender, no masses felt, adnexa: no masses or tenderness noted. Bladder is non tender and no masses felt.    Assessment:     1. Burning with urination   2. LLQ pain       Plan:     Meds ordered this encounter  Medications  . sulfamethoxazole-trimethoprim (BACTRIM DS,SEPTRA DS) 800-160 MG tablet    Sig: Take 1 tablet by mouth 2 (two) times daily. Take 1 bid    Dispense:  14 tablet    Refill:  0    Order Specific Question:   Supervising Provider    Answer:   Tania Ade H [[id number]]  Push fluids F/U prn Take AZO,has at home

## 2017-08-26 ENCOUNTER — Telehealth: Payer: Self-pay | Admitting: Adult Health

## 2017-08-26 MED ORDER — FLUCONAZOLE 150 MG PO TABS: ORAL_TABLET | ORAL | 1 refills | Status: DC

## 2017-08-26 NOTE — Telephone Encounter (Signed)
Rx for diflucan sent to [facility]

## 2017-08-26 NOTE — Telephone Encounter (Signed)
Pt recently completed abx for uti. Now experiencing uti symptoms. Patient requesting rx for diflucan to [facility]. Will send message to [place].

## 2017-10-01 ENCOUNTER — Encounter: Payer: Self-pay | Admitting: *Deleted

## 2017-10-01 ENCOUNTER — Ambulatory Visit: Payer: Medicaid Other | Admitting: Advanced Practice Midwife

## 2017-10-23 ENCOUNTER — Ambulatory Visit: Payer: Medicaid Other | Admitting: Advanced Practice Midwife

## 2017-10-23 ENCOUNTER — Other Ambulatory Visit: Payer: Self-pay

## 2017-10-23 ENCOUNTER — Encounter: Payer: Self-pay | Admitting: Advanced Practice Midwife

## 2017-10-23 VITALS — BP 118/74 | HR 99 | Ht 65.0 in | Wt 137.0 lb

## 2017-10-23 DIAGNOSIS — R102 Pelvic and perineal pain: Secondary | ICD-10-CM

## 2017-10-23 NOTE — Progress Notes (Signed)
[facility] Visit  Patient name: Kayla Nicholson MRN [id number]  Date of birth: 1990/11/07  CC & HPI:  Kayla Nicholson is a 27 y.o.  female presenting today for pain with intercourse.  She was having sex on 7/30 and felt a sudden pain in Left side which made her stop having sex, lasted for hours and is till somewhat sore.  Hx ovarian cysts, using spermacide/WD for [facility].  Just got accepted to [employer]! Has tried nearly every form of hormonal BC, all of them make her moody. May add condoms to current regiment Pertinent History Reviewed:  Medical & Surgical Hx:   Past Medical History:  Diagnosis Date  . Back pain   . BV (bacterial vaginosis) 12/22/2013  . Contraceptive management 02/18/2014  . Irregular bleeding 07/27/2014  . LLQ discomfort 07/27/2014  . Trichimoniasis   . Urinary frequency 07/27/2014  . Yeast infection 04/27/2014   Past Surgical History:  Procedure Laterality Date  . LAPAROSCOPIC OVARIAN CYSTECTOMY Right 08/25/2014   Procedure: LAPAROSCOPIC OVARIAN CYSTECTOMY;  Surgeon: Florian Buff, MD;  Location: [facility];  Service: Gynecology;  Laterality: Right;   Family History  Problem Relation Age of Onset  . Hypertension Mother   . Ovarian cysts Sister   . Hypertension Maternal Grandmother   . Diabetes Maternal Grandmother   . Ovarian cysts Maternal Grandmother   . Hernia Daughter     Current Outpatient Medications:  .  AZO-CRANBERRY PO, Take by mouth daily., Disp: , Rfl:  Social History: Reviewed -  reports that she has never smoked. She has never used smokeless tobacco.  Review of Systems:   Constitutional: Negative for fever and chills Eyes: Negative for visual disturbances Respiratory: Negative for shortness of breath, dyspnea Cardiovascular: Negative for chest pain or palpitations  Gastrointestinal: Negative for vomiting, diarrhea and constipation; no abdominal pain Genitourinary: Negative for dysuria and urgency, vaginal irritation or  itching Musculoskeletal: Negative for back pain, joint pain, myalgias  Neurological: Negative for dizziness and headaches    Objective Findings:    Physical Examination: Vitals:   10/23/17 1517  BP: 118/74  Pulse: 99   General appearance - well appearing, and in no distress Mental status - alert, oriented to person, place, and time Chest:  Normal respiratory effort Heart - normal rate and regular rhythm Abdomen:  Soft, nontender Pelvic: right side nontender . Left side tender to bimanual.  Musculoskeletal:  Normal range of motion without pain Extremities:  No edema    No results found for this or any previous visit (from the past 24 hour(s)).    Assessment & Plan:  A:   ? Ruptured vs currant ovarian cyst P:  Check STDs  If pain persists, [place]   Return for If you have any problems.  Christin Fudge CNM 10/23/2017 3:59 PM

## 2017-10-26 LAB — GC/CHLAMYDIA PROBE AMP
Chlamydia trachomatis, NAA: NEGATIVE
Neisseria gonorrhoeae by PCR: NEGATIVE

## 2017-10-26 LAB — TRICHOMONAS VAGINALIS, PROBE AMP: Trich vag by NAA: NEGATIVE

## 2017-11-04 ENCOUNTER — Encounter: Payer: Self-pay | Admitting: *Deleted

## 2017-11-04 ENCOUNTER — Ambulatory Visit: Payer: Medicaid Other | Admitting: Women's Health

## 2017-12-02 ENCOUNTER — Other Ambulatory Visit: Payer: Self-pay | Admitting: Adult Health

## 2017-12-06 ENCOUNTER — Encounter: Payer: Self-pay | Admitting: *Deleted

## 2017-12-06 ENCOUNTER — Telehealth: Payer: Self-pay | Admitting: Adult Health

## 2017-12-06 NOTE — Telephone Encounter (Signed)
Patient called stating that she had the pharmacy fax over a refill for her medication for her yeast infection on the 16 th of this month, pt states she got one medication but not the other. Please contact pt

## 2017-12-06 NOTE — Telephone Encounter (Signed)
Unable to leave message

## 2017-12-11 ENCOUNTER — Telehealth: Payer: Self-pay | Admitting: Obstetrics & Gynecology

## 2017-12-11 ENCOUNTER — Other Ambulatory Visit: Payer: Self-pay | Admitting: Adult Health

## 2017-12-11 ENCOUNTER — Telehealth: Payer: Self-pay | Admitting: Adult Health

## 2017-12-11 MED ORDER — NITROFURANTOIN MONOHYD MACRO 100 MG PO CAPS: 100.0000 mg | ORAL_CAPSULE | Freq: Two times a day (BID) | ORAL | 0 refills | Status: DC

## 2017-12-11 MED ORDER — FLUCONAZOLE 150 MG PO TABS: ORAL_TABLET | ORAL | 1 refills | Status: DC

## 2017-12-11 NOTE — Telephone Encounter (Signed)
Pt requests macrobid and diflucan, will rx but if not better make appt,

## 2018-01-07 ENCOUNTER — Other Ambulatory Visit: Payer: Self-pay | Admitting: Adult Health

## 2018-03-10 ENCOUNTER — Emergency Department (HOSPITAL_COMMUNITY)
Admission: EM | Admit: 2018-03-10 | Discharge: 2018-03-10 | Disposition: A | Discharge status: Home / Self Care | Payer: Medicaid Other | Attending: Emergency Medicine | Admitting: Emergency Medicine

## 2018-03-10 ENCOUNTER — Encounter (HOSPITAL_COMMUNITY): Payer: Self-pay | Admitting: Emergency Medicine

## 2018-03-10 ENCOUNTER — Other Ambulatory Visit: Payer: Self-pay

## 2018-03-10 DIAGNOSIS — Y929 Unspecified place or not applicable: Secondary | ICD-10-CM | POA: Diagnosis not present

## 2018-03-10 DIAGNOSIS — R1032 Left lower quadrant pain: Secondary | ICD-10-CM | POA: Diagnosis present

## 2018-03-10 DIAGNOSIS — Y9389 Activity, other specified: Secondary | ICD-10-CM | POA: Diagnosis not present

## 2018-03-10 DIAGNOSIS — Y999 Unspecified external cause status: Secondary | ICD-10-CM | POA: Diagnosis not present

## 2018-03-10 DIAGNOSIS — S76212A Strain of adductor muscle, fascia and tendon of left thigh, initial encounter: Secondary | ICD-10-CM | POA: Insufficient documentation

## 2018-03-10 DIAGNOSIS — T148XXA Other injury of unspecified body region, initial encounter: Secondary | ICD-10-CM

## 2018-03-10 DIAGNOSIS — X500XXA Overexertion from strenuous movement or load, initial encounter: Secondary | ICD-10-CM | POA: Diagnosis not present

## 2018-03-10 LAB — POC URINE PREG, ED: Preg Test, Ur: NEGATIVE

## 2018-03-10 MED ORDER — METHOCARBAMOL 500 MG PO TABS: 500.0000 mg | ORAL_TABLET | Freq: Two times a day (BID) | ORAL | 0 refills | Status: DC

## 2018-03-10 MED ORDER — METHOCARBAMOL 500 MG PO TABS: 500.0000 mg | ORAL_TABLET | Freq: Two times a day (BID) | ORAL | 0 refills | Status: AC

## 2018-03-10 NOTE — ED Notes (Signed)
This RN went to d/c pt at this time, pt and visitor both not in room. No personal belongings noted to be in room.

## 2018-03-10 NOTE — ED Triage Notes (Signed)
Pt states she was helping her mother move furniture and began having left groin pain. Pt reports hurts to lift or pick up her leg.

## 2018-03-10 NOTE — ED Provider Notes (Signed)
[facility] EMERGENCY DEPARTMENT Provider Note   CSN: [id number] Arrival date & time: 03/10/18  2107     History   Chief Complaint Chief Complaint  Patient presents with  . Groin Injury    HPI Kayla Nicholson is a 27 y.o. female.  HPI  Patient is a 27 year old female presenting the emergency department today for evaluation of left groin pain that began 2 days ago.  Patient states that she helps her mother move some heavy furniture 2 days ago.  Following this, she developed pain to the left groin area.  She is concerned that she pulled a muscle.  Pain is intermittent, it is only present with movement and with lifting her left leg.  She has no pain when she is not moving.  She denies any abdominal pain, nausea, diarrhea, constipation or urinary symptoms.  She does state that she had one episode of vomiting prior to arrival after a coughing fit.  Has had no further episodes and does not feel nauseated.  No vaginal bleeding or discharge.  Denies any concern for pregnancy.  Past Medical History:  Diagnosis Date  . Back pain   . BV (bacterial vaginosis) 12/22/2013  . Contraceptive management 02/18/2014  . Irregular bleeding 07/27/2014  . LLQ discomfort 07/27/2014  . Trichimoniasis   . Urinary frequency 07/27/2014  . Yeast infection 04/27/2014    Patient Active Problem List   Diagnosis Date Noted  . Right ovarian cyst 11/23/2016  . Irregular bleeding 07/27/2014  . Yeast infection 04/27/2014  . BV (bacterial vaginosis) 12/22/2013    Past Surgical History:  Procedure Laterality Date  . LAPAROSCOPIC OVARIAN CYSTECTOMY Right 08/25/2014   Procedure: LAPAROSCOPIC OVARIAN CYSTECTOMY;  Surgeon: Florian Buff, MD;  Location: [facility];  Service: Gynecology;  Laterality: Right;     OB History    Gravida  1   Para  1   Term      Preterm  1   AB      Living  1     SAB      TAB      Ectopic      Multiple      Live Births  1            Home Medications    Prior to  Admission medications   Medication Sig Start Date End Date Taking? Authorizing Provider  AZO-CRANBERRY PO Take 1 tablet by mouth daily. Chewable   Yes [provider]  methocarbamol (ROBAXIN) 500 MG tablet Take 1 tablet (500 mg total) by mouth 2 (two) times daily for 5 days. 03/10/18 03/15/18  Kiren Mcisaac S, PA-C    Family History Family History  Problem Relation Age of Onset  . Hypertension Mother   . Ovarian cysts Sister   . Hypertension Maternal Grandmother   . Diabetes Maternal Grandmother   . Ovarian cysts Maternal Grandmother   . Hernia Daughter     Social History Social History   Tobacco Use  . Smoking status: Never Smoker  . Smokeless tobacco: Never Used  Substance Use Topics  . Alcohol use: Yes    Comment: occassional  . Drug use: No     Allergies   Bee venom; Macrobid [nitrofurantoin monohyd macro]; and Metronidazole   Review of Systems Review of Systems  Constitutional: Negative for fever.  Eyes: Negative for visual disturbance.  Respiratory: Negative for shortness of breath.   Cardiovascular: Negative for chest pain.  Gastrointestinal: Positive for vomiting. Negative for abdominal pain,  constipation, diarrhea and nausea.  Genitourinary: Negative for pelvic pain.  Musculoskeletal: Negative for back pain.       Left groin pain     Physical Exam Updated Vital Signs BP 110/71 (BP Location: Right Arm)   Pulse 81   Temp 98 F (36.7 C) (Oral)   Resp 18   Ht 5\' 5"  (1.651 m)   Wt 60.8 kg   LMP 02/22/2018   SpO2 99%   BMI 22.30 kg/m   Physical Exam Vitals signs and nursing note reviewed.  Constitutional:      General: She is not in acute distress.    Appearance: She is well-developed.  HENT:     Head: Normocephalic and atraumatic.  Eyes:     Conjunctiva/sclera: Conjunctivae normal.  Neck:     Musculoskeletal: Neck supple.  Cardiovascular:     Rate and Rhythm: Normal rate and regular rhythm.     Heart sounds: No murmur.    Pulmonary:     Effort: Pulmonary effort is normal. No respiratory distress.     Breath sounds: Normal breath sounds.  Abdominal:     General: Abdomen is flat. Bowel sounds are normal. There is no distension.     Palpations: Abdomen is soft. There is no mass.     Tenderness: There is no abdominal tenderness. There is no guarding or rebound.     Hernia: No hernia is present.  Musculoskeletal: Normal range of motion.     Comments: TTP along the left inguinal area that reproduces pain. Pain exacerbated with internal and external rotation of the hip. FROM of the BLE.   Skin:    General: Skin is warm and dry.  Neurological:     Mental Status: She is alert.     ED Treatments / Results  Labs (all labs ordered are listed, but only abnormal results are displayed) Labs Reviewed  POC URINE PREG, ED    EKG None  Radiology No results found.  Procedures Procedures (including critical care time)  Medications Ordered in ED Medications - No data to display   Initial Impression / Assessment and Plan / ED Course  I have reviewed the triage vital signs and the nursing notes.  Pertinent labs & imaging results that were available during my care of the patient were reviewed by me and considered in my medical decision making (see chart for details).    Final Clinical Impressions(s) / ED Diagnoses   Final diagnoses:  Muscle strain   Patient presenting the ED today complaining of pain to the left inguinal area that began after lifting some heavy objects 2 days ago.  No pain at rest, pain only present with movement.  Pain is reproducible on exam and is located to the left inguinal area.  She has no significant abdominal or suprapubic tenderness to suggest intra-abdominal or reproductive pathology of symptoms.  Given her history, symptoms do seem most likely to be musculoskeletal in nature.  Discussed option of completing pelvic exam, lab work and potential imaging of the abdomen or pelvis to  rule out other causes, however given low suspicion I would recommend deferring this work-up and monitoring symptoms.  Patient in agreement with plan to defer further work-up however agrees to obtain pregnancy test to rule out pregnancy and possible ectopic.  Pregnancy test is negative.  Will discharge home with Rx for muscle relaxers.  Advised Tylenol, Motrin and warm/cool compresses for symptoms.  Advised her to return to the ER for new or worsening symptoms.  She voiced understanding the plan was to return.  All questions answered.  ED Discharge Orders         Ordered    methocarbamol (ROBAXIN) 500 MG tablet  2 times daily     03/10/18 2207           Bishop Dublin 03/10/18 2207    Milton Ferguson, MD 03/10/18 2253

## 2018-03-10 NOTE — ED Notes (Signed)
ED Provider at bedside. 

## 2018-03-10 NOTE — Discharge Instructions (Signed)

## 2018-04-10 ENCOUNTER — Other Ambulatory Visit (HOSPITAL_COMMUNITY)
Admission: RE | Admit: 2018-04-10 | Discharge: 2018-04-10 | Disposition: A | Discharge status: Home / Self Care | Payer: Medicaid Other | Source: Ambulatory Visit | Attending: Adult Health | Admitting: Adult Health

## 2018-04-10 ENCOUNTER — Encounter: Payer: Self-pay | Admitting: Adult Health

## 2018-04-10 ENCOUNTER — Ambulatory Visit: Payer: Medicaid Other | Admitting: Adult Health

## 2018-04-10 VITALS — BP 104/66 | HR 90 | Ht 65.0 in | Wt 136.0 lb

## 2018-04-10 DIAGNOSIS — Z113 Encounter for screening for infections with a predominantly sexual mode of transmission: Secondary | ICD-10-CM | POA: Insufficient documentation

## 2018-04-10 DIAGNOSIS — B9689 Other specified bacterial agents as the cause of diseases classified elsewhere: Secondary | ICD-10-CM | POA: Diagnosis not present

## 2018-04-10 DIAGNOSIS — Z01419 Encounter for gynecological examination (general) (routine) without abnormal findings: Secondary | ICD-10-CM | POA: Diagnosis not present

## 2018-04-10 DIAGNOSIS — Z0000 Encounter for general adult medical examination without abnormal findings: Secondary | ICD-10-CM | POA: Insufficient documentation

## 2018-04-10 DIAGNOSIS — R1032 Left lower quadrant pain: Secondary | ICD-10-CM

## 2018-04-10 DIAGNOSIS — N898 Other specified noninflammatory disorders of vagina: Secondary | ICD-10-CM | POA: Insufficient documentation

## 2018-04-10 DIAGNOSIS — Z8742 Personal history of other diseases of the female genital tract: Secondary | ICD-10-CM

## 2018-04-10 DIAGNOSIS — N760 Acute vaginitis: Secondary | ICD-10-CM

## 2018-04-10 DIAGNOSIS — Z9889 Other specified postprocedural states: Secondary | ICD-10-CM

## 2018-04-10 LAB — POCT WET PREP (WET MOUNT)
CLUE CELLS WET PREP WHIFF POC: POSITIVE
WBC, Wet Prep HPF POC: POSITIVE

## 2018-04-10 MED ORDER — METRONIDAZOLE 0.75 % VA GEL: 1.0000 | Freq: Every day | VAGINAL | 0 refills | Status: DC

## 2018-04-10 NOTE — Patient Instructions (Signed)
Ovarian Cyst         An ovarian cyst is a fluid-filled sac that forms on an ovary. The ovaries are small organs that produce eggs in women. Various types of cysts can form on the ovaries. Some may cause symptoms and require treatment. Most ovarian cysts go away on their own, are not cancerous (are benign), and do not cause problems.  Common types of ovarian cysts include:  · Functional (follicle) cysts.  ? Occur during the menstrual cycle, and usually go away with the next menstrual cycle if you do not get pregnant.  ? Usually cause no symptoms.  · Endometriomas.  ? Are cysts that form from the tissue that lines the uterus (endometrium).  ? Are sometimes called “chocolate cysts” because they become filled with blood that turns brown.  ? Can cause pain in the lower abdomen during intercourse and during your period.  · Cystadenoma cysts.  ? Develop from cells on the outside surface of the ovary.  ? Can get very large and cause lower abdomen pain and pain with intercourse.  ? Can cause severe pain if they twist or break open (rupture).  · Dermoid cysts.  ? Are sometimes found in both ovaries.  ? May contain different kinds of body tissue, such as skin, teeth, hair, or cartilage.  ? Usually do not cause symptoms unless they get very big.  · Theca lutein cysts.  ? Occur when too much of a certain hormone (human chorionic gonadotropin) is produced and overstimulates the ovaries to produce an egg.  ? Are most common after having procedures used to assist with the conception of a baby (in vitro fertilization).  What are the causes?  Ovarian cysts may be caused by:  · Ovarian hyperstimulation syndrome. This is a condition that can develop from taking fertility medicines. It causes multiple large ovarian cysts to form.  · Polycystic ovarian syndrome (PCOS). This is a common hormonal disorder that can cause ovarian cysts, as well as problems with your period or fertility.  What increases the risk?  The following factors may  make you more likely to develop ovarian cysts:  · Being overweight or obese.  · Taking fertility medicines.  · Taking certain forms of hormonal birth control.  · Smoking.  What are the signs or symptoms?  Many ovarian cysts do not cause symptoms. If symptoms are present, they may include:  · Pelvic pain or pressure.  · Pain in the lower abdomen.  · Pain during sex.  · Abdominal swelling.  · Abnormal menstrual periods.  · Increasing pain with menstrual periods.  How is this diagnosed?  These cysts are commonly found during a routine pelvic exam. You may have tests to find out more about the cyst, such as:  · Ultrasound.  · X-ray of the pelvis.  · CT scan.  · MRI.  · Blood tests.  How is this treated?  Many ovarian cysts go away on their own without treatment. Your health care provider may want to check your cyst regularly for 2-3 months to see if it changes. If you are in menopause, it is especially important to have your cyst monitored closely because menopausal women have a higher rate of ovarian cancer.  When treatment is needed, it may include:  · Medicines to help relieve pain.  · A procedure to drain the cyst (aspiration).  · Surgery to remove the whole cyst.  · Hormone treatment or birth control pills. These methods are sometimes used   to help dissolve a cyst.  Follow these instructions at home:  · Take over-the-counter and prescription medicines only as told by your health care provider.  · Do not drive or use heavy machinery while taking prescription pain medicine.  · Get regular pelvic exams and Pap tests as often as told by your health care provider.  · Return to your normal activities as told by your health care provider. Ask your health care provider what activities are safe for you.  · Do not use any products that contain nicotine or tobacco, such as cigarettes and e-cigarettes. If you need help quitting, ask your health care provider.  · Keep all follow-up visits as told by your health care provider.  This is important.  Contact a health care provider if:  · Your periods are late, irregular, or painful, or they stop.  · You have pelvic pain that does not go away.  · You have pressure on your bladder or trouble emptying your bladder completely.  · You have pain during sex.  · You have any of the following in your abdomen:  ? A feeling of fullness.  ? Pressure.  ? Discomfort.  ? Pain that does not go away.  ? Swelling.  · You feel generally ill.  · You become constipated.  · You lose your appetite.  · You develop severe acne.  · You start to have more body hair and facial hair.  · You are gaining weight or losing weight without changing your exercise and eating habits.  · You think you may be pregnant.  Get help right away if:  · You have abdominal pain that is severe or gets worse.  · You cannot eat or drink without vomiting.  · You suddenly develop a fever.  · Your menstrual period is much heavier than usual.  This information is not intended to replace advice given to you by your health care provider. Make sure you discuss any questions you have with your health care provider.  Document Released: 03/05/2005 Document Revised: 09/23/2015 Document Reviewed: 08/07/2015  Elsevier Interactive Patient Education © 2019 Elsevier Inc.

## 2018-04-10 NOTE — Progress Notes (Signed)
[name], female   DOB: 1990/07/11, 28 y.o.   MRN: [id number] History of Present Illness:  Kayla is a 28 year old black female, single, G1P1 in for a well woman gyn exam and pap. She works at [employer] and is in [employer] and is hoping to go to [employer] for [employer] in the Fall. PCP is [facility].  Current Medications, Allergies, Past Medical History, Past Surgical History, Family History and Social History were reviewed in [employer] record.     Review of Systems: Patient denies any headaches, hearing loss, fatigue, blurred vision, shortness of breath, chest pain, abdominal pain, problems with bowel movements, urination, or intercourse. No joint pain or mood swings. Has vaginal discharge, had ?yeast infection and used monistat which burned, and has pain LLQ seen in ER at [facility] in December ?groin pull, but still hurts with certain movement.  She uses condoms.    Physical Exam:BP 104/66 (BP Location: Left Arm, Patient Position: Sitting, Cuff Size: Normal)   Pulse 90   Ht 5\' 5"  (1.651 m)   Wt 136 lb (61.7 kg)   LMP 03/25/2018   BMI 22.63 kg/m  General:  Well developed, well nourished, no acute distress Skin:  Warm and dry Neck:  Midline trachea, normal thyroid, good ROM, no lymphadenopathy Lungs; Clear to auscultation bilaterally Breast:  No dominant palpable mass, retraction, or nipple discharge Cardiovascular: Regular rate and rhythm Abdomen:  Soft, non tender, no hepatosplenomegaly Pelvic:  External genitalia is normal in appearance, no lesions.  The vagina is pink with creamy white discharge and odor.Marland Kitchen Urethra has no lesions or masses. The cervix is bulbous. Pap with HPV reflex and GC/CHL performed. Uterus is felt to be normal size, shape, and contour.  No adnexal masses or tenderness noted.Bladder is non tender, no masses felt. Wet Prep:+WBC and clue cells, +whiff. Extremities/musculoskeletal:  No swelling or varicosities noted, no clubbing or  cyanosis Psych:  No mood changes, alert and cooperative,seems happy Fall risk is low. PHQ 9 score 1. Examination chaperoned by Diona Fanti CMA. Will get [place] to assess LLQ pain, may be ovarian cyst, has had cystectomy in the past. She prefers metrogel over flagyl.    Impression: 1. Encounter for gynecological examination with Papanicolaou smear of cervix   2. Vaginal discharge   3. Vaginal odor   4. BV (bacterial vaginosis)   5. LLQ pain   6. History of ovarian cystectomy   7. Screening examination for STD (sexually transmitted disease)       Plan: Pap with HPV reflex and GC/CHL sent Check HIV and RPR Meds ordered this encounter  Medications  . metroNIDAZOLE (METROGEL) 0.75 % vaginal gel    Sig: Place 1 Applicatorful vaginally at bedtime.    Dispense:  70 g    Refill:  0    Order Specific Question:   Supervising Provider    Answer:   Tania Ade H [[id number]]  Return in 1 week for GYN [place] Physical in 1 year Pap in 3 if normal  Use condoms  Review handout on ovarian cyst

## 2018-04-10 NOTE — Addendum Note (Signed)
Addended by: Diona Fanti A on: 04/10/2018 10:11 AM   Modules accepted: Orders

## 2018-04-11 LAB — HIV ANTIBODY (ROUTINE TESTING W REFLEX): HIV Screen 4th Generation wRfx: NONREACTIVE

## 2018-04-11 LAB — RPR: RPR: NONREACTIVE

## 2018-04-16 LAB — CYTOLOGY - PAP
Chlamydia: POSITIVE — AB
Diagnosis: UNDETERMINED — AB
HPV (WINDOPATH): DETECTED — AB
Neisseria Gonorrhea: NEGATIVE

## 2018-04-17 ENCOUNTER — Encounter: Payer: Self-pay | Admitting: Adult Health

## 2018-04-17 ENCOUNTER — Other Ambulatory Visit: Payer: Medicaid Other

## 2018-04-17 ENCOUNTER — Telehealth: Payer: Self-pay | Admitting: Adult Health

## 2018-04-17 DIAGNOSIS — A749 Chlamydial infection, unspecified: Secondary | ICD-10-CM

## 2018-04-17 DIAGNOSIS — R8789 Other abnormal findings in specimens from female genital organs: Secondary | ICD-10-CM | POA: Insufficient documentation

## 2018-04-17 DIAGNOSIS — R87618 Other abnormal cytological findings on specimens from cervix uteri: Secondary | ICD-10-CM

## 2018-04-17 HISTORY — DX: Other abnormal cytological findings on specimens from cervix uteri: R87.618

## 2018-04-17 HISTORY — DX: Chlamydial infection, unspecified: A74.9

## 2018-04-17 MED ORDER — AZITHROMYCIN 500 MG PO TABS: ORAL_TABLET | ORAL | 0 refills | Status: DC

## 2018-04-17 NOTE — Telephone Encounter (Signed)
Pt aware of abnormal pap, ASCUS with +HPV, will make colpo appt with Dr Elonda Husky,, and that rx for azithromycin sent, no sex til after 58.Partner to clinic

## 2018-04-25 ENCOUNTER — Other Ambulatory Visit: Payer: Medicaid Other

## 2018-04-29 ENCOUNTER — Ambulatory Visit (INDEPENDENT_AMBULATORY_CARE_PROVIDER_SITE_OTHER): Payer: Medicaid Other | Admitting: Obstetrics & Gynecology

## 2018-04-29 ENCOUNTER — Other Ambulatory Visit: Payer: Self-pay | Admitting: Obstetrics & Gynecology

## 2018-04-29 ENCOUNTER — Encounter: Payer: Self-pay | Admitting: Obstetrics & Gynecology

## 2018-04-29 VITALS — BP 113/77 | HR 83 | Ht 65.0 in | Wt 129.0 lb

## 2018-04-29 DIAGNOSIS — R87810 Cervical high risk human papillomavirus (HPV) DNA test positive: Secondary | ICD-10-CM

## 2018-04-29 DIAGNOSIS — R87610 Atypical squamous cells of undetermined significance on cytologic smear of cervix (ASC-US): Secondary | ICD-10-CM | POA: Diagnosis not present

## 2018-04-29 DIAGNOSIS — Z3202 Encounter for pregnancy test, result negative: Secondary | ICD-10-CM

## 2018-04-29 NOTE — Progress Notes (Signed)
Colposcopy Procedure Note:  Colposcopy Procedure Note  Indications: Pap smear 1 months ago showed: ASCUS with POSITIVE high risk HPV. The prior pap showed no abnormalities.  Prior cervical/vaginal disease: . Prior cervical treatment: .  Smoker:  No. New sexual partner:  No.    History of abnormal Pap: no  Procedure Details  The risks and benefits of the procedure and Written informed consent obtained.  Speculum placed in vagina and excellent visualization of cervix achieved, cervix swabbed x 3 with acetic acid solution.  Findings: Cervix: ; SCJ visualized - lesion at 3-9 o'clock, cervical biopsies taken at 6 o'clock, specimen labelled and sent to pathology and hemostasis achieved with Monsel's solution.  Large extension SCJ with HPV atypia Vaginal inspection: normal without visible lesions. Vulvar colposcopy: vulvar colposcopy not performed.  Specimens: cx bx  Complications: none.  Plan: Will send MyChart of path results

## 2018-04-29 NOTE — Addendum Note (Signed)
Addended by: Christiana Pellant A on: 04/29/2018 03:41 PM   Modules accepted: Orders

## 2018-05-22 ENCOUNTER — Telehealth: Payer: Self-pay | Admitting: Adult Health

## 2018-05-22 NOTE — Telephone Encounter (Signed)
Patient called stating that she has a genital wart and she would like to know if Anderson Malta could call her in a medication for it. Please contact pt

## 2018-05-22 NOTE — Telephone Encounter (Signed)
Spoke with pt. Pt states she thinks she is having a breakout of genital warts. Has never had this before. Recently diagnosed with HPV. Please advise. Thanks!! [place]

## 2018-05-23 NOTE — Telephone Encounter (Signed)
Left message that will to be seen before treated,s o call and make appt

## 2018-05-30 ENCOUNTER — Other Ambulatory Visit: Payer: Self-pay | Admitting: Adult Health

## 2018-06-04 ENCOUNTER — Other Ambulatory Visit: Payer: Self-pay

## 2018-06-04 ENCOUNTER — Encounter: Payer: Self-pay | Admitting: Adult Health

## 2018-06-04 ENCOUNTER — Ambulatory Visit (INDEPENDENT_AMBULATORY_CARE_PROVIDER_SITE_OTHER): Payer: Medicaid Other | Admitting: Adult Health

## 2018-06-04 VITALS — BP 118/75 | HR 84 | Ht 65.0 in | Wt 136.0 lb

## 2018-06-04 DIAGNOSIS — N898 Other specified noninflammatory disorders of vagina: Secondary | ICD-10-CM | POA: Diagnosis not present

## 2018-06-04 DIAGNOSIS — N760 Acute vaginitis: Secondary | ICD-10-CM | POA: Diagnosis not present

## 2018-06-04 DIAGNOSIS — B9689 Other specified bacterial agents as the cause of diseases classified elsewhere: Secondary | ICD-10-CM

## 2018-06-04 LAB — POCT WET PREP (WET MOUNT)
Clue Cells Wet Prep Whiff POC: POSITIVE
Trichomonas Wet Prep HPF POC: ABSENT
WBC WET PREP: POSITIVE

## 2018-06-04 MED ORDER — METRONIDAZOLE 0.75 % VA GEL: 1.0000 | Freq: Every day | VAGINAL | 1 refills | Status: DC

## 2018-06-04 NOTE — Patient Instructions (Signed)
Bacterial Vaginosis    Bacterial vaginosis is a vaginal infection that occurs when the normal balance of bacteria in the vagina is disrupted. It results from an overgrowth of certain bacteria. This is the most common vaginal infection among women ages 15-44.  Because bacterial vaginosis increases your risk for STIs (sexually transmitted infections), getting treated can help reduce your risk for chlamydia, gonorrhea, herpes, and HIV (human immunodeficiency virus). Treatment is also important for preventing complications in pregnant women, because this condition can cause an early (premature) delivery.  What are the causes?  This condition is caused by an increase in harmful bacteria that are normally present in small amounts in the vagina. However, the reason that the condition develops is not fully understood.  What increases the risk?  The following factors may make you more likely to develop this condition:  · Having a new sexual partner or multiple sexual partners.  · Having unprotected sex.  · Douching.  · Having an intrauterine device (IUD).  · Smoking.  · Drug and alcohol abuse.  · Taking certain antibiotic medicines.  · Being pregnant.  You cannot get bacterial vaginosis from toilet seats, bedding, swimming pools, or contact with objects around you.  What are the signs or symptoms?  Symptoms of this condition include:  · Grey or white vaginal discharge. The discharge can also be watery or foamy.  · A fish-like odor with discharge, especially after sexual intercourse or during menstruation.  · Itching in and around the vagina.  · Burning or pain with urination.  Some women with bacterial vaginosis have no signs or symptoms.  How is this diagnosed?  This condition is diagnosed based on:  · Your medical history.  · A physical exam of the vagina.  · Testing a sample of vaginal fluid under a microscope to look for a large amount of bad bacteria or abnormal cells. Your health care provider may use a cotton swab or  a small wooden spatula to collect the sample.  How is this treated?  This condition is treated with antibiotics. These may be given as a pill, a vaginal cream, or a medicine that is put into the vagina (suppository). If the condition comes back after treatment, a second round of antibiotics may be needed.  Follow these instructions at home:  Medicines  · Take over-the-counter and prescription medicines only as told by your health care provider.  · Take or use your antibiotic as told by your health care provider. Do not stop taking or using the antibiotic even if you start to feel better.  General instructions  · If you have a female sexual partner, tell her that you have a vaginal infection. She should see her health care provider and be treated if she has symptoms. If you have a female sexual partner, he does not need treatment.  · During treatment:  ? Avoid sexual activity until you finish treatment.  ? Do not douche.  ? Avoid alcohol as directed by your health care provider.  ? Avoid breastfeeding as directed by your health care provider.  · Drink enough water and fluids to keep your urine clear or pale yellow.  · Keep the area around your vagina and rectum clean.  ? Wash the area daily with warm water.  ? Wipe yourself from front to back after using the toilet.  · Keep all follow-up visits as told by your health care provider. This is important.  How is this prevented?  · Do not   douche.  · Wash the outside of your vagina with warm water only.  · Use protection when having sex. This includes latex condoms and dental dams.  · Limit how many sexual partners you have. To help prevent bacterial vaginosis, it is best to have sex with just one partner (monogamous).  · Make sure you and your sexual partner are tested for STIs.  · Wear cotton or cotton-lined underwear.  · Avoid wearing tight pants and pantyhose, especially during summer.  · Limit the amount of alcohol that you drink.  · Do not use any products that contain  nicotine or tobacco, such as cigarettes and e-cigarettes. If you need help quitting, ask your health care provider.  · Do not use illegal drugs.  Where to find more information  · Centers for Disease Control and Prevention: www.cdc.gov/std  · American Sexual Health Association (ASHA): www.ashastd.org  · U.S. Department of Health and Human Services, Office on Women's Health: www.womenshealth.gov/ or https://www.womenshealth.gov/a-z-topics/bacterial-vaginosis  Contact a health care provider if:  · Your symptoms do not improve, even after treatment.  · You have more discharge or pain when urinating.  · You have a fever.  · You have pain in your abdomen.  · You have pain during sex.  · You have vaginal bleeding between periods.  Summary  · Bacterial vaginosis is a vaginal infection that occurs when the normal balance of bacteria in the vagina is disrupted.  · Because bacterial vaginosis increases your risk for STIs (sexually transmitted infections), getting treated can help reduce your risk for chlamydia, gonorrhea, herpes, and HIV (human immunodeficiency virus). Treatment is also important for preventing complications in pregnant women, because the condition can cause an early (premature) delivery.  · This condition is treated with antibiotic medicines. These may be given as a pill, a vaginal cream, or a medicine that is put into the vagina (suppository).  This information is not intended to replace advice given to you by your health care provider. Make sure you discuss any questions you have with your health care provider.  Document Released: 03/05/2005 Document Revised: 07/09/2016 Document Reviewed: 11/19/2015  Elsevier Interactive Patient Education © 2019 Elsevier Inc.

## 2018-06-04 NOTE — Progress Notes (Signed)
[name], female   DOB: 1990/04/09, 28 y.o.   MRN: [id number] History of Present Illness: Kayla is a 28 year old black female, G1P1 in complaining of vaginal odor and itching with discharge. She thinks it may be BV.  PCP is [facility]    Current Medications, Allergies, Past Medical History, Past Surgical History, Family History and Social History were reviewed in [employer] record.     Review of Systems: +vaginal odor +vaginal discharge  +vaginal itching     Physical Exam:BP 118/75 (BP Location: Left Arm, Patient Position: Sitting, Cuff Size: Normal)   Pulse 84   Ht 5\' 5"  (1.651 m)   Wt 136 lb (61.7 kg)   LMP 05/15/2018   BMI 22.63 kg/m  General:  Well developed, well nourished, no acute distress Skin:  Warm and dry Pelvic:  External genitalia is normal in appearance, no lesions.  The vagina is normal in appearance, has greenish creamy discharge with odor. Urethra has no lesions or masses. The cervix is bulbous.  Uterus is felt to be normal size, shape, and contour.  No adnexal masses or tenderness noted.Bladder is non tender, no masses felt.Nuswab obtained. Wet prep:+WBC and clue cells.  Psych:  No mood changes, alert and cooperative,seems happy PHQ 2 score 0. Examination chaperoned by Diona Fanti CMA.   Impression: 1. BV (bacterial vaginosis)   2. Vaginal odor   3. Vaginal discharge   4. Vaginal itching       Plan:  Meds ordered this encounter  Medications  . metroNIDAZOLE (METROGEL) 0.75 % vaginal gel    Sig: Place 1 Applicatorful vaginally at bedtime.    Dispense:  70 g    Refill:  1    Order Specific Question:   Supervising Provider    Answer:   Florian Buff [[id number]]  Nuswab sent Use condoms if has sex F/U prn Review handout on BV

## 2018-06-11 LAB — NUSWAB VAGINITIS PLUS (VG+)
Atopobium vaginae: HIGH Score — AB
BVAB 2: HIGH Score — AB
Candida albicans, NAA: NEGATIVE
Candida glabrata, NAA: NEGATIVE
Chlamydia trachomatis, NAA: NEGATIVE
Neisseria gonorrhoeae, NAA: NEGATIVE
TRICH VAG BY NAA: NEGATIVE

## 2018-08-04 ENCOUNTER — Telehealth: Payer: Self-pay | Admitting: Adult Health

## 2018-08-04 NOTE — Telephone Encounter (Signed)
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 Pt states that she might have something inflammatory. Please contact pt

## 2018-08-05 NOTE — Telephone Encounter (Signed)
Pt scheduled for visit on 5/21. Will address concerns at that time.

## 2018-08-07 ENCOUNTER — Encounter: Payer: Self-pay | Admitting: Adult Health

## 2018-08-07 ENCOUNTER — Other Ambulatory Visit: Payer: Self-pay

## 2018-08-07 ENCOUNTER — Ambulatory Visit: Payer: Medicaid Other | Admitting: Adult Health

## 2018-08-07 VITALS — BP 116/89 | HR 101 | Temp 99.2°F | Ht 65.0 in | Wt 130.5 lb

## 2018-08-07 DIAGNOSIS — R102 Pelvic and perineal pain unspecified side: Secondary | ICD-10-CM

## 2018-08-07 DIAGNOSIS — Z113 Encounter for screening for infections with a predominantly sexual mode of transmission: Secondary | ICD-10-CM

## 2018-08-07 NOTE — Progress Notes (Signed)
[name], female   DOB: June 28, 1990, 28 y.o.   MRN: [id number] History of Present Illness: Kayla is a 28 year old black female, G1P0101, in complaining that stomach hurts on and off, but more when on period and when tries to run.Was seen by PCP and given muscle relaxer and did not take. Has had this pain about 6 months. PCP is [facility].    Current Medications, Allergies, Past Medical History, Past Surgical History, Family History and Social History were reviewed in [employer] record.     Review of Systems: "stomach hurts" on and off, more so with period, hurts or pulls when tries to run, has had pain for about 6 months  Denies any pain with sex, "but it has been a while, but enjoyed it then" Denies problems with urination    Physical Exam:BP 116/89 (BP Location: Left Arm, Patient Position: Sitting, Cuff Size: Normal)   Pulse (!) 101   Temp 99.2 F (37.3 C)   Ht 5\' 5"  (1.651 m)   Wt 130 lb 8 oz (59.2 kg)   LMP 08/03/2018   BMI 21.72 kg/m  General:  Well developed, well nourished, no acute distress Skin:  Warm and dry Pelvic:  External genitalia is normal in appearance, no lesions.  The vagina is normal in appearance, +period blood. Urethra has no lesions or masses. The cervix is smooth, no CMT.  Uterus is felt to be normal size, shape, and contour, mildly tender.  No adnexal masses or tenderness noted.Bladder is non tender, no masses felt.Has some discomfort when leans over and lefts leg, but can bend with no pain to touch toes.No CVAT.  Nuswab obtained.  Psych:  No mood changes, alert and cooperative,seems happy Fall risk is low. Examination chaperoned by [name] LPN.   Impression and Plan: 1. Pelvic pain Discussed could be cyst, endometriosis or even her back but less likely that, Will get [place] to assess uterus and ovaries, and if normal may try OCs - NuSwab Vaginitis Plus (VG+) - US PELVIS (TRANSABDOMINAL ONLY); Future - US PELVIS  TRANSVANGINAL NON-OB (TV ONLY); Future Return in 2 week for [place] and will talk when results back  2. Screening examination for STD (sexually transmitted disease) - NuSwab Vaginitis Plus (VG+)

## 2018-08-20 ENCOUNTER — Telehealth: Payer: Self-pay | Admitting: *Deleted

## 2018-08-20 LAB — NUSWAB VAGINITIS PLUS (VG+)
Candida albicans, NAA: NEGATIVE
Candida glabrata, NAA: NEGATIVE
Chlamydia trachomatis, NAA: NEGATIVE
Neisseria gonorrhoeae, NAA: NEGATIVE
Trich vag by NAA: NEGATIVE

## 2018-08-20 NOTE — Telephone Encounter (Signed)
Mailbox full @ 2:28 pm. [facility]

## 2018-08-20 NOTE — Telephone Encounter (Signed)
Mailbox full @ 9:41 am. [employer]

## 2018-08-20 NOTE — Telephone Encounter (Signed)
-----   Message from Estill Dooms, NP sent at 08/20/2018  9:33 AM EDT ----- Let her know nuswab was negative

## 2018-08-21 ENCOUNTER — Other Ambulatory Visit: Payer: Medicaid Other

## 2018-08-21 NOTE — Telephone Encounter (Signed)
Pt aware Nuswab was negative. [place]

## 2018-09-09 ENCOUNTER — Other Ambulatory Visit: Payer: Self-pay | Admitting: Adult Health

## 2018-10-20 ENCOUNTER — Other Ambulatory Visit: Payer: Medicaid Other

## 2018-11-04 ENCOUNTER — Other Ambulatory Visit: Payer: Self-pay | Admitting: Adult Health

## 2018-11-04 MED ORDER — METRONIDAZOLE 0.75 % VA GEL: VAGINAL | 0 refills | Status: DC

## 2018-11-04 NOTE — Telephone Encounter (Signed)
Pt requesting a refill of metrogel.

## 2018-11-05 ENCOUNTER — Other Ambulatory Visit: Payer: Medicaid Other

## 2018-12-30 ENCOUNTER — Other Ambulatory Visit: Payer: Self-pay | Admitting: Women's Health

## 2019-01-28 ENCOUNTER — Ambulatory Visit: Payer: Medicaid Other | Admitting: Adult Health

## 2019-02-27 ENCOUNTER — Ambulatory Visit: Payer: Medicaid Other | Admitting: Adult Health

## 2019-03-04 ENCOUNTER — Other Ambulatory Visit: Payer: Self-pay | Admitting: Women's Health

## 2019-03-09 ENCOUNTER — Other Ambulatory Visit: Payer: Self-pay | Admitting: *Deleted

## 2019-03-09 MED ORDER — METRONIDAZOLE 0.75 % VA GEL: VAGINAL | 0 refills | Status: DC

## 2019-03-10 ENCOUNTER — Other Ambulatory Visit: Payer: Medicaid Other

## 2019-03-11 ENCOUNTER — Other Ambulatory Visit: Payer: Medicaid Other

## 2019-03-19 ENCOUNTER — Emergency Department (HOSPITAL_COMMUNITY)
Admission: EM | Admit: 2019-03-19 | Discharge: 2019-03-19 | Discharge status: Against Medical Advice | Payer: Medicaid Other

## 2019-03-19 ENCOUNTER — Other Ambulatory Visit: Payer: Self-pay

## 2019-03-28 ENCOUNTER — Emergency Department: Admit: 2019-03-28 | Payer: MEDICAID | Primary: Family Medicine

## 2019-03-28 ENCOUNTER — Inpatient Hospital Stay
Admit: 2019-03-28 | Discharge: 2019-03-28 | Disposition: A | Discharge status: Home / Self Care | Payer: MEDICAID | Attending: Emergency Medicine

## 2019-03-28 DIAGNOSIS — S61216A Laceration without foreign body of right little finger without damage to nail, initial encounter: Secondary | ICD-10-CM

## 2019-03-28 MED ORDER — IBUPROFEN 800 MG TAB
800 mg | ORAL_TABLET | Freq: Four times a day (QID) | ORAL | 0 refills | Status: AC | PRN
Start: 2019-03-28 — End: 2019-04-04

## 2019-03-28 MED ORDER — TRAMADOL 50 MG TAB
50 mg | ORAL | Status: AC
Start: 2019-03-28 — End: 2019-03-28
  Administered 2019-03-28: 16:00:00 via ORAL

## 2019-03-28 MED ORDER — IBUPROFEN 400 MG TAB
400 mg | Freq: Once | ORAL | Status: AC
Start: 2019-03-28 — End: 2019-03-28
  Administered 2019-03-28: 16:00:00 via ORAL

## 2019-03-28 MED FILL — TRAMADOL 50 MG TAB: 50 mg | ORAL | Qty: 1

## 2019-03-28 MED FILL — IBUPROFEN 400 MG TAB: 400 mg | ORAL | Qty: 2
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
## 2019-03-28 NOTE — ED Notes (Signed)
Patient's finger bandaged.
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
## 2019-03-28 NOTE — ED Notes (Signed)
Patient's finger bandaged.
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
## 2019-04-09 ENCOUNTER — Emergency Department (HOSPITAL_COMMUNITY)
Admission: EM | Admit: 2019-04-09 | Discharge: 2019-04-09 | Discharge status: Absent without leave | Payer: Medicaid Other

## 2019-04-09 ENCOUNTER — Other Ambulatory Visit: Payer: Self-pay

## 2019-04-22 ENCOUNTER — Other Ambulatory Visit: Payer: Self-pay | Admitting: Women's Health

## 2019-04-22 ENCOUNTER — Telehealth: Payer: Self-pay | Admitting: *Deleted

## 2019-04-22 NOTE — Telephone Encounter (Signed)
Calling because the pharmacy faxed a refill request to [place].

## 2019-04-24 ENCOUNTER — Telehealth: Payer: Self-pay | Admitting: *Deleted

## 2019-04-24 ENCOUNTER — Other Ambulatory Visit: Payer: Self-pay

## 2019-04-24 ENCOUNTER — Encounter: Payer: Self-pay | Admitting: *Deleted

## 2019-04-24 MED ORDER — METRONIDAZOLE 0.75 % VA GEL: VAGINAL | 0 refills | Status: DC

## 2019-04-27 NOTE — Telephone Encounter (Signed)
metrogel ordered

## 2019-04-30 ENCOUNTER — Inpatient Hospital Stay (HOSPITAL_COMMUNITY): Admission: RE | Admit: 2019-04-30 | Payer: Medicaid Other | Source: Ambulatory Visit

## 2019-06-11 ENCOUNTER — Encounter: Payer: Self-pay | Admitting: Adult Health

## 2019-06-11 ENCOUNTER — Other Ambulatory Visit (HOSPITAL_COMMUNITY)
Admission: RE | Admit: 2019-06-11 | Discharge: 2019-06-11 | Disposition: A | Discharge status: Home / Self Care | Payer: Medicaid Other | Source: Ambulatory Visit | Attending: Adult Health | Admitting: Adult Health

## 2019-06-11 ENCOUNTER — Other Ambulatory Visit: Payer: Self-pay

## 2019-06-11 ENCOUNTER — Ambulatory Visit (INDEPENDENT_AMBULATORY_CARE_PROVIDER_SITE_OTHER): Payer: Medicaid Other | Admitting: Adult Health

## 2019-06-11 VITALS — BP 98/62 | HR 77 | Ht 65.0 in | Wt 135.6 lb

## 2019-06-11 DIAGNOSIS — Z1151 Encounter for screening for human papillomavirus (HPV): Secondary | ICD-10-CM | POA: Diagnosis not present

## 2019-06-11 DIAGNOSIS — Z0000 Encounter for general adult medical examination without abnormal findings: Secondary | ICD-10-CM | POA: Diagnosis not present

## 2019-06-11 DIAGNOSIS — Z01419 Encounter for gynecological examination (general) (routine) without abnormal findings: Secondary | ICD-10-CM | POA: Insufficient documentation

## 2019-06-11 DIAGNOSIS — Z8742 Personal history of other diseases of the female genital tract: Secondary | ICD-10-CM | POA: Diagnosis not present

## 2019-06-11 DIAGNOSIS — Z30013 Encounter for initial prescription of injectable contraceptive: Secondary | ICD-10-CM | POA: Insufficient documentation

## 2019-06-11 MED ORDER — MEDROXYPROGESTERONE ACETATE 150 MG/ML IM SUSP: 150.0000 mg | INTRAMUSCULAR | 4 refills | Status: DC

## 2019-06-11 NOTE — Progress Notes (Signed)
[name], female   DOB: 04/17/1990, 29 y.o.   MRN: [id number] History of Present Illness: Kayla is a 29 year old black female,single,G1P0101, in for a well woman gyn exam and pap, her last pap was 04/10/2018 was ASCUS +HPV.She had colpo and biopsy that showed no dysplasia. She does want to get on birth control, she wants depo. PCP is [facility]  Current Medications, Allergies, Past Medical History, Past Surgical History, Family History and Social History were reviewed in [employer] record.     Review of Systems:  Patient denies any headaches, hearing loss, fatigue, blurred vision, shortness of breath, chest pain, abdominal pain, problems with bowel movements, urination, or intercourse. No joint pain or mood swings.   Physical Exam:BP 98/62 (BP Location: Left Arm, Patient Position: Sitting, Cuff Size: Normal)   Pulse 77   Ht 5\' 5"  (1.651 m)   Wt 135 lb 9.6 oz (61.5 kg)   LMP 05/28/2019 (Exact Date)   BMI 22.57 kg/m  General:  Well developed, well nourished, no acute distress Skin:  Warm and dry Neck:  Midline trachea, normal thyroid, good ROM, no lymphadenopathy Lungs; Clear to auscultation bilaterally Breast:  No dominant palpable mass, retraction, or nipple discharge Cardiovascular: Regular rate and rhythm Abdomen:  Soft, non tender, no hepatosplenomegaly Pelvic:  External genitalia is normal in appearance, no lesions.  The vagina is normal in appearance. Urethra has no lesions or masses. The cervix is bulbous.+ovulation mucous, pap with GC/CHL and high risk HPV 16/18 genotyping performed.  Uterus is felt to be normal size, shape, and contour.  No adnexal masses or tenderness noted.Bladder is non tender, no masses felt. Extremities/musculoskeletal:  No swelling or varicosities noted, no clubbing or cyanosis Psych:  No mood changes, alert and cooperative,seems happy Fall risk is low pHQ 2 score is 0 Alcohol audit is 1. Examination chaperoned by Estill Bamberg  Rash LPN.  Impression and Plan: 1. Encounter for gynecological examination with Papanicolaou smear of cervix Pap sent Physical in 1 year Pap in 3 if normal  2. History of abnormal cervical Pap smear   3. Encounter for initial prescription of injectable contraceptive Will rx depo, call with period for appointment for injection Meds ordered this encounter  Medications  . medroxyPROGESTERone (DEPO-PROVERA) 150 MG/ML injection    Sig: Inject 1 mL (150 mg total) into the muscle every 3 (three) months.    Dispense:  1 mL    Refill:  4    Order Specific Question:   Supervising Provider    Answer:   Tania Ade H [[id number]]

## 2019-06-15 LAB — CYTOLOGY - PAP
Chlamydia: NEGATIVE
Comment: NEGATIVE
Comment: NEGATIVE
Comment: NORMAL
Diagnosis: NEGATIVE
High risk HPV: NEGATIVE
Neisseria Gonorrhea: NEGATIVE

## 2019-06-20 ENCOUNTER — Telehealth: Payer: Medicaid Other

## 2019-06-25 ENCOUNTER — Telehealth: Payer: Self-pay | Admitting: Adult Health

## 2019-06-25 NOTE — Telephone Encounter (Signed)
Patient called and requested an appointment for her "lady parts" because she's having issues.  Got her scheduled for that and then she requested to schedule her depo.  I placed her on hold to look for a time for labs and depo.  Went back to offer her 07/14/19 and she was no longer on the line.  Tried to call her back, not able to reach her on one line and then another line said it was a wrong number.

## 2019-07-02 ENCOUNTER — Ambulatory Visit: Payer: Medicaid Other | Admitting: Women's Health

## 2019-07-09 ENCOUNTER — Other Ambulatory Visit: Payer: Self-pay

## 2019-07-09 ENCOUNTER — Ambulatory Visit (INDEPENDENT_AMBULATORY_CARE_PROVIDER_SITE_OTHER): Payer: Medicaid Other | Admitting: Adult Health

## 2019-07-09 ENCOUNTER — Encounter: Payer: Self-pay | Admitting: Adult Health

## 2019-07-09 VITALS — BP 106/68 | HR 66 | Ht 65.0 in | Wt 134.0 lb

## 2019-07-09 DIAGNOSIS — N926 Irregular menstruation, unspecified: Secondary | ICD-10-CM

## 2019-07-09 DIAGNOSIS — N898 Other specified noninflammatory disorders of vagina: Secondary | ICD-10-CM | POA: Diagnosis not present

## 2019-07-09 MED ORDER — METRONIDAZOLE 0.75 % VA GEL: 1.0000 | Freq: Every day | VAGINAL | 1 refills | Status: DC

## 2019-07-09 NOTE — Progress Notes (Signed)
  Subjective:     [name], female   DOB: 1991/02/10, 29 y.o.   MRN: [id number]  HPI Kayla is a 29 year old black female,single, G1P0101, in complaining of vaginal odor and discharge with itching and burning and now having BTB. She did not start depo, may want to get pregnant in near future.  PCP is [facility].  Review of Systems Had vaginal odor and used metrogel x 1  and was better then came back Vaginal discharge Having BTB +vaginal itching and burning   Reviewed past medical,surgical, social and family history. Reviewed medications and allergies.     Objective:   Physical Exam BP 106/68 (BP Location: Left Arm, Patient Position: Sitting, Cuff Size: Normal)   Pulse 66   Ht 5\' 5"  (1.651 m)   Wt 134 lb (60.8 kg)   LMP 06/25/2019 (Exact Date)   BMI 22.30 kg/m   Skin warm and dry.Pelvic: external genitalia is normal in appearance no lesions, vagina: scant red blood without odor,urethra has no lesions or masses noted, cervix:smooth and bulbous, uterus: normal size, shape and contour, non tender, no masses felt, adnexa: no masses or tenderness noted. Bladder is non tender and no masses felt. Nuswab obtained  Examination chaperoned by [name] LPN    Assessment:    1. Vaginal odor Rx Metrogel  nuswab sent Meds ordered this encounter  Medications  . metroNIDAZOLE (METROGEL VAGINAL) 0.75 % vaginal gel    Sig: Place 1 Applicatorful vaginally at bedtime.    Dispense:  70 g    Refill:  1    Order Specific Question:   Supervising Provider    Answer:   Elonda Husky, LUTHER H [[id number]]     2. Vaginal discharge Rx Metrogel Nuswab sent  3. Vaginal itching Rx Metrogel Nuswab sent   4. Irregular bleeding,BTB  Will watch for now     Plan:     Follow up prn

## 2019-07-11 LAB — NUSWAB VAGINITIS PLUS (VG+)
Candida albicans, NAA: NEGATIVE
Candida glabrata, NAA: NEGATIVE
Chlamydia trachomatis, NAA: NEGATIVE
Neisseria gonorrhoeae, NAA: NEGATIVE
Trich vag by NAA: NEGATIVE

## 2019-07-29 ENCOUNTER — Other Ambulatory Visit: Payer: Self-pay | Admitting: Adult Health

## 2019-07-29 DIAGNOSIS — Z319 Encounter for procreative management, unspecified: Secondary | ICD-10-CM

## 2019-07-29 NOTE — Progress Notes (Signed)
Order in for progesterone  level

## 2019-08-10 ENCOUNTER — Other Ambulatory Visit: Payer: Medicaid Other

## 2019-09-10 ENCOUNTER — Encounter: Payer: Self-pay | Admitting: *Deleted

## 2019-09-16 ENCOUNTER — Ambulatory Visit: Payer: Medicaid Other | Admitting: Adult Health

## 2019-09-29 ENCOUNTER — Emergency Department (HOSPITAL_COMMUNITY)
Admission: EM | Admit: 2019-09-29 | Discharge: 2019-09-29 | Disposition: A | Discharge status: Home / Self Care | Payer: Medicaid Other | Attending: Emergency Medicine | Admitting: Emergency Medicine

## 2019-09-29 ENCOUNTER — Encounter: Payer: Self-pay | Admitting: Adult Health

## 2019-09-29 ENCOUNTER — Other Ambulatory Visit: Payer: Self-pay

## 2019-09-29 ENCOUNTER — Encounter (HOSPITAL_COMMUNITY): Payer: Self-pay | Admitting: *Deleted

## 2019-09-29 ENCOUNTER — Ambulatory Visit (INDEPENDENT_AMBULATORY_CARE_PROVIDER_SITE_OTHER): Payer: Medicaid Other | Admitting: Adult Health

## 2019-09-29 VITALS — BP 126/68 | HR 89 | Ht 65.0 in | Wt 129.0 lb

## 2019-09-29 DIAGNOSIS — Z113 Encounter for screening for infections with a predominantly sexual mode of transmission: Secondary | ICD-10-CM

## 2019-09-29 DIAGNOSIS — B9689 Other specified bacterial agents as the cause of diseases classified elsewhere: Secondary | ICD-10-CM

## 2019-09-29 DIAGNOSIS — N898 Other specified noninflammatory disorders of vagina: Secondary | ICD-10-CM

## 2019-09-29 DIAGNOSIS — R1030 Lower abdominal pain, unspecified: Secondary | ICD-10-CM

## 2019-09-29 DIAGNOSIS — N760 Acute vaginitis: Secondary | ICD-10-CM

## 2019-09-29 DIAGNOSIS — R1032 Left lower quadrant pain: Secondary | ICD-10-CM | POA: Diagnosis present

## 2019-09-29 LAB — POCT WET PREP (WET MOUNT)
Clue Cells Wet Prep Whiff POC: POSITIVE
WBC, Wet Prep HPF POC: POSITIVE

## 2019-09-29 LAB — URINALYSIS, ROUTINE W REFLEX MICROSCOPIC
Bacteria, UA: NONE SEEN
Bilirubin Urine: NEGATIVE
Glucose, UA: NEGATIVE mg/dL
Hgb urine dipstick: NEGATIVE
Ketones, ur: NEGATIVE mg/dL
Nitrite: NEGATIVE
Protein, ur: NEGATIVE mg/dL
Specific Gravity, Urine: 1.02 (ref 1.005–1.030)
pH: 6 (ref 5.0–8.0)

## 2019-09-29 LAB — PREGNANCY, URINE: Preg Test, Ur: NEGATIVE

## 2019-09-29 MED ORDER — NYSTATIN-TRIAMCINOLONE 100000-0.1 UNIT/GM-% EX OINT
1.0000 | TOPICAL_OINTMENT | Freq: Two times a day (BID) | CUTANEOUS | 0 refills | Status: DC
Start: 2019-09-29 — End: 2020-08-29

## 2019-09-29 MED ORDER — METRONIDAZOLE 0.75 % VA GEL: 1.0000 | Freq: Every day | VAGINAL | 0 refills | Status: DC

## 2019-09-29 NOTE — Progress Notes (Signed)
  Subjective:     [name], female   DOB: 08/17/1990, 29 y.o.   MRN: [id number]  HPI Kayla is a 29 year old black female, single, G1P0101, in complaining of vaginal pain and discharge with odor and itching, had sex Sunday, had been at [facility] for training.   Review of Systems Had sex Sunday and has vaginal discharge with itching and odor Has vaginal pain at introitus and did have oral sex,and he has beard  Reviewed past medical,surgical, social and family history. Reviewed medications and allergies.     Objective:   Physical Exam BP 126/68 (BP Location: Left Arm, Patient Position: Sitting, Cuff Size: Normal)   Pulse 89   Ht 5\' 5"  (1.651 m)   Wt 129 lb (58.5 kg)   LMP 09/21/2019   BMI 21.47 kg/m  Skin warm and dry.Pelvic: external genitalia is normal in appearance no lesions, vagina: white discharge with odor,urethra has no lesions or masses noted, cervix:smooth and bulbous, uterus: normal size, shape and contour, non tender, no masses felt, adnexa: no masses or tenderness noted. Bladder is non tender and no masses felt. Wet prep: + for clue cells and +WBCs. Nuswab obtained Examination chaperoned by [name] LPN Fall risk is low  Upstream - 09/29/19 1058      Pregnancy Intention Screening   Does the patient want to become pregnant in the next year? Yes    Does the patient's partner want to become pregnant in the next year? Yes    Would the patient like to discuss contraceptive options today? No      Contraception Wrap Up   Current Method Female Condom    End Method Female Condom    Contraception Counseling Provided No             Assessment:     1. Vaginal discharge -nuswab sent   2. Vaginal itching -nuswab sent Will rx mycolog to use at introitus   3. Vaginal odor -nuswab sent   4. BV (bacterial vaginosis) -nuswab sent Will rx metrogel Meds ordered this encounter  Medications  . metroNIDAZOLE (METROGEL VAGINAL) 0.75 % vaginal gel    Sig:  Place 1 Applicatorful vaginally at bedtime.    Dispense:  70 g    Refill:  0    Order Specific Question:   Supervising Provider    Answer:   EURE, LUTHER H [[id number]]  . nystatin-triamcinolone ointment (MYCOLOG)    Sig: Apply 1 application topically 2 (two) times daily.    Dispense:  30 g    Refill:  0    Order Specific Question:   Supervising Provider    Answer:   EURE, LUTHER H [[id number]]    5. Screening examination for STD (sexually transmitted disease) nuswab sent Check HIV,RPR and hepatitis C antibody and HSV 1&2 antibody at her request     Plan:     Use condoms  Will talk when labs back  Follow up prn

## 2019-09-29 NOTE — ED Triage Notes (Signed)
Pt c/o generalized abdominal pain x 3 days; pt states she has some gray, odorous discharge from vagina

## 2019-09-29 NOTE — ED Notes (Signed)
This patient removed BP cuff, walked passed the nurses station stating " I need some fucking water", leaving unit.  Patient had just asked for a drink and after being told that staff would make sure its ok with the doctor first.

## 2019-09-29 NOTE — ED Provider Notes (Signed)
[facility] EMERGENCY DEPARTMENT Provider Note   CSN: [id number] Arrival date & time: 09/29/19  9622     History Chief Complaint  Patient presents with  . Abdominal Pain    Kayla Nicholson is a 29 y.o. female.  Patient complains of left lower quadrant abdominal pain.  No vomiting no nausea mild discharge  The history is provided by the patient. No language interpreter was used.  Abdominal Pain Pain location:  LLQ Pain quality: aching   Pain radiates to:  Does not radiate Pain severity:  Moderate Timing:  Constant Chronicity:  New Context: not alcohol use   Relieved by:  Nothing Worsened by:  Nothing Associated symptoms: no chest pain, no cough, no diarrhea, no fatigue and no hematuria        Past Medical History:  Diagnosis Date  . Abnormal Papanicolaou smear of cervix with positive human papilloma virus (HPV) test 04/17/2018   ASCUS with +HPV, will get colpo   . Back pain   . BV (bacterial vaginosis) 12/22/2013  . Chlamydia 04/17/2018  . Contraceptive management 02/18/2014  . Irregular bleeding 07/27/2014  . LLQ discomfort 07/27/2014  . Trichimoniasis   . Urinary frequency 07/27/2014  . Yeast infection 04/27/2014    Patient Active Problem List   Diagnosis Date Noted  . History of abnormal cervical Pap smear 06/11/2019  . Encounter for initial prescription of injectable contraceptive 06/11/2019  . Pelvic pain 08/07/2018  . Vaginal itching 06/04/2018  . Abnormal Papanicolaou smear of cervix with positive human papilloma virus (HPV) test 04/17/2018  . Chlamydia 04/17/2018  . History of ovarian cystectomy 04/10/2018  . LLQ pain 04/10/2018  . Vaginal odor 04/10/2018  . Vaginal discharge 04/10/2018  . Encounter for gynecological examination with Papanicolaou smear of cervix 04/10/2018  . Screening examination for STD (sexually transmitted disease) 04/10/2018  . Right ovarian cyst 11/23/2016  . Irregular bleeding 07/27/2014  . Yeast infection 04/27/2014  . BV  (bacterial vaginosis) 12/22/2013    Past Surgical History:  Procedure Laterality Date  . LAPAROSCOPIC OVARIAN CYSTECTOMY Right 08/25/2014   Procedure: LAPAROSCOPIC OVARIAN CYSTECTOMY;  Surgeon: Florian Buff, MD;  Location: [facility];  Service: Gynecology;  Laterality: Right;     OB History    Gravida  1   Para  1   Term      Preterm  1   AB      Living  1     SAB      TAB      Ectopic      Multiple      Live Births  1           Family History  Problem Relation Age of Onset  . Hypertension Mother   . Ovarian cysts Mother   . Ovarian cysts Sister   . Hypertension Maternal Grandmother   . Diabetes Maternal Grandmother   . Ovarian cysts Maternal Grandmother   . Hernia Daughter   . Ovarian cysts Maternal Aunt     Social History   Tobacco Use  . Smoking status: Never Smoker  . Smokeless tobacco: Never Used  Vaping Use  . Vaping Use: Never used  Substance Use Topics  . Alcohol use: Yes  . Drug use: No    Home Medications Prior to Admission medications   Medication Sig Start Date End Date Taking? Authorizing Provider  medroxyPROGESTERone (DEPO-PROVERA) 150 MG/ML injection Inject 1 mL (150 mg total) into the muscle every 3 (three) months. Patient not taking: Reported  on 07/09/2019 06/11/19   Estill Dooms, NP  metroNIDAZOLE (METROGEL VAGINAL) 0.75 % vaginal gel Place 1 Applicatorful vaginally at bedtime. 07/09/19   Estill Dooms, NP    Allergies    Bee venom, Macrobid [nitrofurantoin monohyd macro], and Metronidazole  Review of Systems   Review of Systems  Constitutional: Negative for appetite change and fatigue.  HENT: Negative for congestion, ear discharge and sinus pressure.   Eyes: Negative for discharge.  Respiratory: Negative for cough.   Cardiovascular: Negative for chest pain.  Gastrointestinal: Positive for abdominal pain. Negative for diarrhea.  Genitourinary: Negative for frequency and hematuria.  Musculoskeletal: Negative for  back pain.  Skin: Negative for rash.  Neurological: Negative for seizures and headaches.  Psychiatric/Behavioral: Negative for hallucinations.    Physical Exam Updated Vital Signs BP 111/73 (BP Location: Left Arm)   Pulse 67   Temp 98.4 F (36.9 C) (Oral)   Resp 16   Ht 5\' 5"  (1.651 m)   Wt 58.1 kg   LMP 09/21/2019   SpO2 99%   BMI 21.30 kg/m   Physical Exam Vitals and nursing note reviewed.  Constitutional:      Appearance: She is well-developed.  HENT:     Head: Normocephalic.     Nose: Nose normal.  Eyes:     General: No scleral icterus.    Conjunctiva/sclera: Conjunctivae normal.  Neck:     Thyroid: No thyromegaly.  Cardiovascular:     Rate and Rhythm: Normal rate and regular rhythm.     Heart sounds: No murmur heard.  No friction rub. No gallop.   Pulmonary:     Breath sounds: No stridor. No wheezing or rales.  Chest:     Chest wall: No tenderness.  Abdominal:     General: There is no distension.     Tenderness: There is abdominal tenderness. There is no rebound.  Musculoskeletal:        General: Normal range of motion.     Cervical back: Neck supple.  Lymphadenopathy:     Cervical: No cervical adenopathy.  Skin:    Findings: No erythema or rash.  Neurological:     Mental Status: She is oriented to person, place, and time.     Motor: No abnormal muscle tone.     Coordination: Coordination normal.  Psychiatric:        Behavior: Behavior normal.     ED Results / Procedures / Treatments   Labs (all labs ordered are listed, but only abnormal results are displayed) Labs Reviewed  URINALYSIS, ROUTINE W REFLEX MICROSCOPIC - Abnormal; Notable for the following components:      Result Value   Leukocytes,Ua TRACE (*)    All other components within normal limits  PREGNANCY, URINE    EKG None  Radiology No results found.  Procedures Procedures (including critical care time)  Medications Ordered in ED Medications - No data to display  ED  Course  I have reviewed the triage vital signs and the nursing notes.  Pertinent labs & imaging results that were available during my care of the patient were reviewed by me and considered in my medical decision making (see chart for details).    MDM Rules/Calculators/A&P                          Patient with abdominal pain.  Patient left AMA before we able to discuss the results of her test.      This  patient presents to the ED for concern of abdominal pain, this involves an extensive number of treatment options, and is a complaint that carries with it a high risk of complications and morbidity.  The differential diagnosis includes STD, ectopic pregnancy   Lab Tests:   I Ordered, reviewed, and interpreted labs, which included urinalysis and pregnancy test which were negative  Medicines ordered:     Imaging Studies ordered:   Additional history obtained:   Additional history obtained from records  Previous records obtained and reviewed.  Consultations Obtained:     Reevaluation:  After the interventions stated above, I reevaluated the patient and found unchanged  Critical Interventions:  .   Final Clinical Impression(s) / ED Diagnoses Final diagnoses:  Lower abdominal pain    Rx / DC Orders ED Discharge Orders    None       Milton Ferguson, MD 09/29/19 [phone]

## 2019-09-30 LAB — HSV 1 ANTIBODY, IGG: HSV 1 Glycoprotein G Ab, IgG: 34.9 index — ABNORMAL HIGH (ref 0.00–0.90)

## 2019-09-30 LAB — HSV 2 ANTIBODY, IGG: HSV 2 IgG, Type Spec: <0.91 index (ref 0.00–0.90)

## 2019-09-30 LAB — RPR: RPR Ser Ql: NONREACTIVE

## 2019-09-30 LAB — HIV ANTIBODY (ROUTINE TESTING W REFLEX): HIV Screen 4th Generation wRfx: NONREACTIVE

## 2019-09-30 LAB — HEPATITIS C ANTIBODY: Hep C Virus Ab: <0.1 s/co ratio (ref 0.0–0.9)

## 2019-10-01 LAB — NUSWAB VAGINITIS PLUS (VG+)
Candida albicans, NAA: NEGATIVE
Candida glabrata, NAA: NEGATIVE
Chlamydia trachomatis, NAA: NEGATIVE
Neisseria gonorrhoeae, NAA: NEGATIVE
Trich vag by NAA: NEGATIVE

## 2019-11-04 ENCOUNTER — Telehealth: Payer: Self-pay | Admitting: Adult Health

## 2019-11-04 NOTE — Telephone Encounter (Signed)
Patient called stating that she needs a refill of her Metro gel and she states that the Metro gel has given her a yeast infection and she needs an antibiotic for this. Please contact pt when medication has been called in.

## 2019-11-05 ENCOUNTER — Other Ambulatory Visit: Payer: Self-pay | Admitting: Adult Health

## 2019-11-05 MED ORDER — METRONIDAZOLE 0.75 % VA GEL: 1.0000 | Freq: Every day | VAGINAL | 1 refills | Status: DC

## 2019-11-05 MED ORDER — FLUCONAZOLE 150 MG PO TABS
ORAL_TABLET | ORAL | 1 refills | Status: DC
Start: 2019-11-05 — End: 2020-03-23

## 2019-11-05 NOTE — Telephone Encounter (Signed)
Attempted to call patient, no answer left vm to call back or respond to Kayla Nicholson.

## 2019-11-05 NOTE — Progress Notes (Signed)
Will rx diflucan and metrogel

## 2020-02-19 ENCOUNTER — Telehealth: Payer: Medicaid Other | Admitting: Physician Assistant

## 2020-02-19 DIAGNOSIS — U071 COVID-19: Secondary | ICD-10-CM

## 2020-02-19 MED ORDER — AEROCHAMBER PLUS FLO-VU MEDIUM MISC
1.0000 | Freq: Once | 0 refills | Status: AC
Start: 2020-02-19 — End: 2020-02-19

## 2020-02-19 MED ORDER — FLUTICASONE PROPIONATE 50 MCG/ACT NA SUSP
2.0000 | Freq: Every day | NASAL | 0 refills | Status: DC
Start: 2020-02-19 — End: 2020-08-29

## 2020-02-19 MED ORDER — BENZONATATE 100 MG PO CAPS
100.0000 mg | ORAL_CAPSULE | Freq: Three times a day (TID) | ORAL | 0 refills | Status: DC | PRN
Start: 2020-02-19 — End: 2020-08-29

## 2020-02-19 MED ORDER — ALBUTEROL SULFATE HFA 108 (90 BASE) MCG/ACT IN AERS
2.0000 | INHALATION_SPRAY | RESPIRATORY_TRACT | 0 refills | Status: DC | PRN
Start: 2020-02-19 — End: 2020-03-23

## 2020-02-19 MED ORDER — ONDANSETRON 4 MG PO TBDP
ORAL_TABLET | ORAL | 0 refills | Status: DC
Start: 2020-02-19 — End: 2020-08-29

## 2020-02-19 NOTE — Addendum Note (Signed)
Addended by: Abigail Butts on: 02/19/2020 11:13 AM   Modules accepted: Orders

## 2020-02-19 NOTE — Progress Notes (Signed)
E-Visit for Corona Virus Screening  We are sorry you are not feeling well. We are here to help!  You have tested positive for COVID-19, meaning that you were infected with the novel coronavirus and could give the virus to others.  It is vitally important that you stay home so you do not spread it to others.      Please continue isolation at home, for at least 10 days since the start of your symptoms and until you have had 24 hours with no fever (without taking a fever reducer) and with improving of symptoms.  Most cases improve 10 days from onset but we have seen a small number of patients who have gotten worse after the 10 days.  Please be sure to watch for worsening symptoms and remain taking the proper precautions.   Go to the nearest hospital ED for assessment if fever/cough/breathlessness are severe or illness seems like a threat to life.    The following symptoms may appear 2-14 days after exposure: . Fever . Cough . Shortness of breath or difficulty breathing . Chills . Repeated shaking with chills . Muscle pain . Headache . Sore throat . New loss of taste or smell . Fatigue . Congestion or runny nose . Nausea or vomiting . Diarrhea  You have been enrolled in [place] for COVID-19. Daily you will receive a questionnaire within the [facility] website. Our COVID-19 response team will be monitoring your responses daily.  You can use medication such as A prescription cough medication called Tessalon Perles 100 mg. You may take 1-2 capsules every 8 hours as needed for cough, A prescription inhaler called Albuterol MDI 90 mcg /actuation 2 puffs every 4 hours as needed for shortness of breath, wheezing, cough and A prescription for Fluticasone nasal spray 2 sprays in each nostril one time per day  You have tested positive for Covid but because you are not considered high risk you do not qualify for monoclonal antibody infusion.  Supportive care is all that is  needed.   You may also take acetaminophen (Tylenol) as needed for fever.  HOME CARE: . Only take medications as instructed by your medical team. . Drink plenty of fluids and get plenty of rest. . A steam or ultrasonic humidifier can help if you have congestion.   GET HELP RIGHT AWAY IF YOU HAVE EMERGENCY WARNING SIGNS.  Call 911 or proceed to your closest emergency facility if: . You develop worsening high fever. . Trouble breathing . Bluish lips or face . Persistent pain or pressure in the chest . New confusion . Inability to wake or stay awake . You cough up blood. . Your symptoms become more severe . Inability to hold down food or fluids  This list is not all possible symptoms. Contact your medical provider for any symptoms that are severe or concerning to you.    Your e-visit answers were reviewed by a board certified advanced clinical practitioner to complete your personal care plan.  Depending on the condition, your plan could have included both over the counter or prescription medications.  If there is a problem please reply once you have received a response from your provider.  Your safety is important to [place].  If you have drug allergies check your prescription carefully.    You can use MyChart to ask questions about today's visit, request a non-urgent call back, or ask for a work or school excuse for 24 hours related to this e-Visit. If it has  been greater than 24 hours you will need to follow up with your provider, or enter a new e-Visit to address those concerns. You will get an e-mail in the next two days asking about your experience.  I hope that your e-visit has been valuable and will speed your recovery. Thank you for using e-visits.     Greater than 5 minutes, yet less than 10 minutes of time have been spent researching, coordinating, and implementing care for this patient today

## 2020-03-02 ENCOUNTER — Emergency Department (HOSPITAL_COMMUNITY)
Admission: EM | Admit: 2020-03-02 | Discharge: 2020-03-02 | Disposition: A | Discharge status: Home / Self Care | Payer: Medicaid Other

## 2020-03-02 ENCOUNTER — Other Ambulatory Visit: Payer: Self-pay

## 2020-03-02 NOTE — ED Triage Notes (Signed)
Pt is upset because we do not fill out FMLA paperwork. Pt states she will be filing a complaint.

## 2020-03-23 ENCOUNTER — Other Ambulatory Visit: Payer: Self-pay | Admitting: Family

## 2020-03-23 MED ORDER — ALBUTEROL SULFATE HFA 108 (90 BASE) MCG/ACT IN AERS
2.0000 | INHALATION_SPRAY | Freq: Four times a day (QID) | RESPIRATORY_TRACT | 0 refills | Status: DC | PRN
Start: 2020-03-23 — End: 2020-08-29

## 2020-05-04 ENCOUNTER — Telehealth: Payer: Self-pay | Admitting: Adult Health

## 2020-05-04 MED ORDER — METRONIDAZOLE 0.75 % VA GEL: 1.0000 | Freq: Every day | VAGINAL | 0 refills | Status: DC

## 2020-05-04 NOTE — Telephone Encounter (Signed)
Pt aware med was sent to pharmacy. JSY °

## 2020-05-04 NOTE — Telephone Encounter (Signed)
Pt says she has BV will rx metrogel at her request

## 2020-05-04 NOTE — Telephone Encounter (Signed)
Patient wants to know if Kayla Nicholson could call in a Metrogel prescription to her Pharmacy for Bacterial infection, per patient. Clinical staff will follow up with patient.

## 2020-06-19 ENCOUNTER — Emergency Department
Admission: EM | Admit: 2020-06-19 | Discharge: 2020-06-19 | Discharge status: Against Medical Advice | Payer: Medicaid Other

## 2020-06-19 NOTE — ED Triage Notes (Signed)
Pt called in [place], not able to locate in [place] or [place] or outside

## 2020-06-19 NOTE — ED Triage Notes (Signed)
Pt in to be seen for possible miscarriage. Pt stated that she was running to her car to get her cell phone so she could let her family know she was here. Awaiting pt return for triage.

## 2020-06-19 NOTE — ED Triage Notes (Signed)
Pt called for triage, no response. 

## 2020-06-19 NOTE — ED Triage Notes (Signed)
Pt called for triage, no response. Unable to locate pt

## 2020-07-07 ENCOUNTER — Other Ambulatory Visit: Payer: Self-pay | Admitting: Adult Health

## 2020-07-22 ENCOUNTER — Other Ambulatory Visit: Payer: Medicaid Other | Admitting: Adult Health

## 2020-07-28 ENCOUNTER — Ambulatory Visit: Payer: Medicaid Other | Admitting: Adult Health

## 2020-08-22 ENCOUNTER — Other Ambulatory Visit: Payer: Medicaid Other

## 2020-08-24 ENCOUNTER — Ambulatory Visit: Payer: Self-pay | Admitting: Nurse Practitioner

## 2020-08-29 ENCOUNTER — Ambulatory Visit: Payer: Medicaid Other | Admitting: Nurse Practitioner

## 2020-08-29 ENCOUNTER — Other Ambulatory Visit: Payer: Self-pay

## 2020-08-29 ENCOUNTER — Encounter: Payer: Self-pay | Admitting: Nurse Practitioner

## 2020-08-29 VITALS — BP 105/66 | HR 82 | Temp 97.8°F | Resp 20 | Ht 65.0 in | Wt 130.0 lb

## 2020-08-29 DIAGNOSIS — Z23 Encounter for immunization: Secondary | ICD-10-CM

## 2020-08-29 DIAGNOSIS — Z0000 Encounter for general adult medical examination without abnormal findings: Secondary | ICD-10-CM | POA: Diagnosis not present

## 2020-08-29 DIAGNOSIS — Z1231 Encounter for screening mammogram for malignant neoplasm of breast: Secondary | ICD-10-CM

## 2020-08-29 NOTE — Assessment & Plan Note (Signed)
Completed, head to toe assessment, no new concerns, completed PAP last year with a negative result, will repeat in 3 years. I  provided education on health maintenance and preventative care. Labs completed today - CBC, CMP, lipid panel- results pending.

## 2020-08-29 NOTE — Patient Instructions (Signed)
Health Maintenance, Female Adopting a healthy lifestyle and getting preventive care are important in promoting health and wellness. Ask your health care provider about: The right schedule for you to have regular tests and exams. Things you can do on your own to prevent diseases and keep yourself healthy. What should I know about diet, weight, and exercise? Eat a healthy diet  Eat a diet that includes plenty of vegetables, fruits, low-fat dairy products, and lean protein. Do not eat a lot of foods that are high in solid fats, added sugars, or sodium.  Maintain a healthy weight Body mass index (BMI) is used to identify weight problems. It estimates body fat based on height and weight. Your health care provider can help determineyour BMI and help you achieve or maintain a healthy weight. Get regular exercise Get regular exercise. This is one of the most important things you can do for your health. Most adults should: Exercise for at least 150 minutes each week. The exercise should increase your heart rate and make you sweat (moderate-intensity exercise). Do strengthening exercises at least twice a week. This is in addition to the moderate-intensity exercise. Spend less time sitting. Even light physical activity can be beneficial. Watch cholesterol and blood lipids Have your blood tested for lipids and cholesterol at 29 years of age, then havethis test every 5 years. Have your cholesterol levels checked more often if: Your lipid or cholesterol levels are high. You are older than 29 years of age. You are at high risk for heart disease. What should I know about cancer screening? Depending on your health history and family history, you may need to have cancer screening at various ages. This may include screening for: Breast cancer. Cervical cancer. Colorectal cancer. Skin cancer. Lung cancer. What should I know about heart disease, diabetes, and high blood pressure? Blood pressure and heart  disease High blood pressure causes heart disease and increases the risk of stroke. This is more likely to develop in people who have high blood pressure readings, are of African descent, or are overweight. Have your blood pressure checked: Every 3-5 years if you are 18-39 years of age. Every year if you are 40 years old or older. Diabetes Have regular diabetes screenings. This checks your fasting blood sugar level. Have the screening done: Once every three years after age 40 if you are at a normal weight and have a low risk for diabetes. More often and at a younger age if you are overweight or have a high risk for diabetes. What should I know about preventing infection? Hepatitis B If you have a higher risk for hepatitis B, you should be screened for this virus. Talk with your health care provider to find out if you are at risk forhepatitis B infection. Hepatitis C Testing is recommended for: Everyone born from 1945 through 1965. Anyone with known risk factors for hepatitis C. Sexually transmitted infections (STIs) Get screened for STIs, including gonorrhea and chlamydia, if: You are sexually active and are younger than 29 years of age. You are older than 29 years of age and your health care provider tells you that you are at risk for this type of infection. Your sexual activity has changed since you were last screened, and you are at increased risk for chlamydia or gonorrhea. Ask your health care provider if you are at risk. Ask your health care provider about whether you are at high risk for HIV. Your health care provider may recommend a prescription medicine to help   prevent HIV infection. If you choose to take medicine to prevent HIV, you should first get tested for HIV. You should then be tested every 3 months for as long as you are taking the medicine. Pregnancy If you are about to stop having your period (premenopausal) and you may become pregnant, seek counseling before you get  pregnant. Take 400 to 800 micrograms (mcg) of folic acid every day if you become pregnant. Ask for birth control (contraception) if you want to prevent pregnancy. Osteoporosis and menopause Osteoporosis is a disease in which the bones lose minerals and strength with aging. This can result in bone fractures. If you are 65 years old or older, or if you are at risk for osteoporosis and fractures, ask your health care provider if you should: Be screened for bone loss. Take a calcium or vitamin D supplement to lower your risk of fractures. Be given hormone replacement therapy (HRT) to treat symptoms of menopause. Follow these instructions at home: Lifestyle Do not use any products that contain nicotine or tobacco, such as cigarettes, e-cigarettes, and chewing tobacco. If you need help quitting, ask your health care provider. Do not use street drugs. Do not share needles. Ask your health care provider for help if you need support or information about quitting drugs. Alcohol use Do not drink alcohol if: Your health care provider tells you not to drink. You are pregnant, may be pregnant, or are planning to become pregnant. If you drink alcohol: Limit how much you use to 0-1 drink a day. Limit intake if you are breastfeeding. Be aware of how much alcohol is in your drink. In the [place] one drink equals one 12 oz bottle of beer (355 mL), one 5 oz glass of wine (148 mL), or one 1 oz glass of hard liquor (44 mL). General instructions Schedule regular health, dental, and eye exams. Stay current with your vaccines. Tell your health care provider if: You often feel depressed. You have ever been abused or do not feel safe at home. Summary Adopting a healthy lifestyle and getting preventive care are important in promoting health and wellness. Follow your health care provider's instructions about healthy diet, exercising, and getting tested or screened for diseases. Follow your health care provider's  instructions on monitoring your cholesterol and blood pressure. This information is not intended to replace advice given to you by your health care provider. Make sure you discuss any questions you have with your healthcare provider. Document Revised: 02/26/2018 Document Reviewed: 02/26/2018 Elsevier Patient Education  2022 [employer]  

## 2020-08-29 NOTE — Progress Notes (Signed)
Established Patient Office Visit  Subjective:  [name], female    DOB: [birth year]  Age: 30 y.o. MRN: [id number]  CC:  Chief Complaint  Patient presents with   Establish Care    HPI Kayla Nicholson presents for .   Encounter for general adult medical examination without abnormal findings  Physical ("At Risk" items are starred): Patient's last physical exam was 1 year ago .  Weight: Appropriate for height (BMI less than 27%) ; yes Blood Pressure: Normal (BP less than 120/80) ; yes Medical History: Patient history reviewed ; Family history reviewed ;  Allergies Reviewed: No change in current allergies ;  Medications Reviewed: Medications reviewed - no changes ;  Lipids: Normal lipid levels ; labs completed Smoking: Life-long non-smoker ;  Physical Activity: Exercises at least 3 times per week ; Runs on the weekend Alcohol/Drug Use: Is a non-drinker ; No illicit drug use ;  Patient is not afflicted from Stress Incontinence and Urge Incontinence  Safety: reviewed ; Patient wears a seat belt, has smoke detectors, has carbon monoxide detectors, practices appropriate gun safety, and doesn't wears sunscreen with extended sun exposure. Dental Care: biannual cleanings, brushes and flosses daily. Ophthalmology/Optometry: Annual visit.  Hearing loss: none Vision impairments: wears prescription glasses  Past Medical History:  Diagnosis Date   Abnormal Papanicolaou smear of cervix with positive human papilloma virus (HPV) test 04/17/2018   ASCUS with +HPV, will get colpo    Back pain    BV (bacterial vaginosis) 12/22/2013   Chlamydia 04/17/2018   Contraceptive management 02/18/2014   Irregular bleeding 07/27/2014   LLQ discomfort 07/27/2014   Trichimoniasis    Urinary frequency 07/27/2014   Yeast infection 04/27/2014    Past Surgical History:  Procedure Laterality Date   LAPAROSCOPIC OVARIAN CYSTECTOMY Right 08/25/2014   Procedure: LAPAROSCOPIC OVARIAN CYSTECTOMY;  Surgeon:  Florian Buff, MD;  Location: [facility];  Service: Gynecology;  Laterality: Right;    Family History  Problem Relation Age of Onset   Hypertension Mother    Ovarian cysts Mother    Ovarian cysts Sister    Hypertension Maternal Grandmother    Diabetes Maternal Grandmother    Ovarian cysts Maternal Grandmother    Hernia Daughter    Ovarian cysts Maternal Aunt     Social History   Socioeconomic History   Marital status: Single    Spouse name: Not on file   Number of children: 1   Years of education: Not on file   Highest education level: Not on file  Occupational History   Not on file  Tobacco Use   Smoking status: Never   Smokeless tobacco: Never  Vaping Use   Vaping Use: Never used  Substance and Sexual Activity   Alcohol use: Yes   Drug use: No   Sexual activity: Yes    Birth control/protection: None, Condom  Other Topics Concern   Not on file  Social History Narrative   Not on file   Social Determinants of Health   Financial Resource Strain: Not on file  Food Insecurity: Not on file  Transportation Needs: Not on file  Physical Activity: Not on file  Stress: Not on file  Social Connections: Not on file  Intimate Partner Violence: Not on file    Outpatient Medications Prior to Visit  Medication Sig Dispense Refill   metroNIDAZOLE (METROGEL) 0.75 % vaginal gel INSERT 1 APPLICATORFUL VAGINALLY AT BEDTIME 70 g 1   albuterol (VENTOLIN HFA) 108 (90  Base) MCG/ACT inhaler Inhale 2 puffs into the lungs every 6 (six) hours as needed for wheezing or shortness of breath. 8 g 0   AZO-CRANBERRY PO Take by mouth daily.     benzonatate (TESSALON PERLES) 100 MG capsule Take 1 capsule (100 mg total) by mouth 3 (three) times daily as needed for cough (cough). 20 capsule 0   fluticasone (FLONASE) 50 MCG/ACT nasal spray Place 2 sprays into both nostrils daily. 9.9 g 0   nystatin-triamcinolone ointment (MYCOLOG) Apply 1 application topically 2 (two) times daily. 30 g 0   ondansetron  (ZOFRAN ODT) 4 MG disintegrating tablet 4mg  ODT q4 hours prn nausea/vomit 10 tablet 0   Probiotic Product (PROBIOTIC PO) Take by mouth. Azo probiotic-daily     No facility-administered medications prior to visit.    Allergies  Allergen Reactions   Bee Venom Shortness Of Breath and Swelling   Macrobid [Nitrofurantoin Monohyd Macro] Nausea Only   Metronidazole Nausea And Vomiting    Pill form-n/v    ROS Review of Systems  HENT: Negative.    Eyes: Negative.   Respiratory: Negative.    Cardiovascular: Negative.   Gastrointestinal: Negative.   Genitourinary: Negative.   Musculoskeletal: Negative.   Skin:  Negative for rash.  All other systems reviewed and are negative.    Objective:    Physical Exam Vitals and nursing note reviewed.  Constitutional:      Appearance: Normal appearance.  HENT:     Head: Normocephalic.     Right Ear: Tympanic membrane, ear canal and external ear normal.     Left Ear: Tympanic membrane, ear canal and external ear normal.     Nose: No congestion.     Mouth/Throat:     Mouth: Mucous membranes are moist.     Pharynx: Oropharynx is clear. No oropharyngeal exudate or posterior oropharyngeal erythema.  Eyes:     Conjunctiva/sclera: Conjunctivae normal.     Pupils: Pupils are equal, round, and reactive to light.  Cardiovascular:     Rate and Rhythm: Normal rate and regular rhythm.     Pulses: Normal pulses.     Heart sounds: Normal heart sounds.  Pulmonary:     Effort: Pulmonary effort is normal.     Breath sounds: Normal breath sounds.  Abdominal:     General: Bowel sounds are normal.  Musculoskeletal:        General: Normal range of motion.  Skin:    Findings: No rash.  Neurological:     Mental Status: She is alert and oriented to person, place, and time.  Psychiatric:        Behavior: Behavior normal.    BP 105/66   Pulse 82   Temp 97.8 F (36.6 C) (Temporal)   Resp 20   Ht 5\' 5"  (1.651 m)   Wt 130 lb (59 kg)   SpO2 100%    BMI 21.63 kg/m  Wt Readings from Last 3 Encounters:  08/29/20 130 lb (59 kg)  09/29/19 128 lb (58.1 kg)  09/29/19 129 lb (58.5 kg)     There are no preventive care reminders to display for this patient.   There are no preventive care reminders to display for this patient.  Lab Results  Component Value Date   TSH 0.801 01/09/2016   Lab Results  Component Value Date   WBC 9.0 11/05/2016   HGB 13.2 11/05/2016   HCT 37.2 11/05/2016   MCV 91.9 11/05/2016   PLT 165 11/05/2016   Lab Results  Component Value Date   NA 137 11/05/2016   K 3.1 (L) 11/05/2016   CO2 25 11/05/2016   GLUCOSE 111 (H) 11/05/2016   BUN 10 11/05/2016   CREATININE 0.64 11/05/2016   BILITOT 0.7 11/05/2016   ALKPHOS 27 (L) 11/05/2016   AST 19 11/05/2016   ALT 10 (L) 11/05/2016   PROT 7.1 11/05/2016   ALBUMIN 4.1 11/05/2016   CALCIUM 8.9 11/05/2016   ANIONGAP 6 11/05/2016       Assessment & Plan:   Problem List Items Addressed This Visit       Other   Annual physical exam - Primary    Completed, head to toe assessment, no new concerns, completed PAP last year with a negative result, will repeat in 3 years. I  provided education on health maintenance and preventative care. Labs completed today - CBC, CMP, lipid panel- results pending.        Relevant Orders   CBC with Differential   Comprehensive metabolic panel   Lipid panel    No orders of the defined types were placed in this encounter.   Follow-up: Return in about 1 year (around 08/29/2021) for Annual Physical.    Ivy Lynn, NP

## 2020-08-30 LAB — CBC WITH DIFFERENTIAL/PLATELET
Basophils Absolute: 0.1 10*3/uL (ref 0.0–0.2)
Basos: 1 %
EOS (ABSOLUTE): 0.2 10*3/uL (ref 0.0–0.4)
Eos: 2 %
Hematocrit: 42.6 % (ref 34.0–46.6)
Hemoglobin: 14 g/dL (ref 11.1–15.9)
Immature Grans (Abs): 0 10*3/uL (ref 0.0–0.1)
Immature Granulocytes: 0 %
Lymphocytes Absolute: 4.5 10*3/uL — ABNORMAL HIGH (ref 0.7–3.1)
Lymphs: 52 %
MCH: 32.7 pg (ref 26.6–33.0)
MCHC: 32.9 g/dL (ref 31.5–35.7)
MCV: 100 fL — ABNORMAL HIGH (ref 79–97)
Monocytes Absolute: 0.6 10*3/uL (ref 0.1–0.9)
Monocytes: 7 %
Neutrophils Absolute: 3.3 10*3/uL (ref 1.4–7.0)
Neutrophils: 38 %
Platelets: 184 10*3/uL (ref 150–450)
RBC: 4.28 x10E6/uL (ref 3.77–5.28)
RDW: 11.4 % — ABNORMAL LOW (ref 11.7–15.4)
WBC: 8.6 10*3/uL (ref 3.4–10.8)

## 2020-08-30 LAB — LIPID PANEL
Chol/HDL Ratio: 2.7 ratio (ref 0.0–4.4)
Cholesterol, Total: 135 mg/dL (ref 100–199)
HDL: 50 mg/dL (ref >39)
LDL Chol Calc (NIH): 67 mg/dL (ref 0–99)
Triglycerides: 98 mg/dL (ref 0–149)
VLDL Cholesterol Cal: 18 mg/dL (ref 5–40)

## 2020-08-30 LAB — COMPREHENSIVE METABOLIC PANEL
ALT: 9 IU/L (ref 0–32)
AST: 12 IU/L (ref 0–40)
Albumin/Globulin Ratio: 1.7 (ref 1.2–2.2)
Albumin: 4.4 g/dL (ref 3.9–5.0)
Alkaline Phosphatase: 45 IU/L (ref 44–121)
BUN/Creatinine Ratio: 12 (ref 9–23)
BUN: 11 mg/dL (ref 6–20)
Bilirubin Total: 0.3 mg/dL (ref 0.0–1.2)
CO2: 24 mmol/L (ref 20–29)
Calcium: 9.9 mg/dL (ref 8.7–10.2)
Chloride: 104 mmol/L (ref 96–106)
Creatinine, Ser: 0.89 mg/dL (ref 0.57–1.00)
Globulin, Total: 2.6 g/dL (ref 1.5–4.5)
Glucose: 74 mg/dL (ref 65–99)
Potassium: 4.5 mmol/L (ref 3.5–5.2)
Sodium: 143 mmol/L (ref 134–144)
Total Protein: 7 g/dL (ref 6.0–8.5)
eGFR: 91 mL/min/{1.73_m2} (ref >59)

## 2020-08-31 NOTE — Progress Notes (Signed)
CALLED PATIENT TO REVIEW LABS. PATIENT IS UPSET AND STATES SHE ALREADY HAD A CALL REGARDING LAB RESULTS AND SHE ADVISED THAT SHE WANTED ADDITIONAL TESTING DUE TO HX OF ANEMIA AND FAMILIAL HX. I AM NOT ABLE TO SEE ANY PREVIOUS DOCUMENTATION OF CALL. PATIENT STATES SHE IS UNCOMFORTABLE WITH HOW THIS WAS HANDLED AND SHE WILL TAKE A COPY OF HER LABS TO BE TESTED ELSEWHERE AND CALL WAS TERMINATED.

## 2020-09-06 ENCOUNTER — Other Ambulatory Visit: Payer: Self-pay | Admitting: Adult Health

## 2020-09-28 ENCOUNTER — Ambulatory Visit: Payer: Medicaid Other | Admitting: Podiatry

## 2020-09-29 ENCOUNTER — Telehealth: Payer: Self-pay | Admitting: Podiatry

## 2020-09-29 NOTE — Telephone Encounter (Signed)
Called and left voicemail for patient regarding her missed new patient appointment she made via MyChart due to having a piece of glass stuck in her foot. Asked patient to call us back and let us know if she needs to reschedule the appointment.

## 2020-10-10 ENCOUNTER — Telehealth: Payer: Self-pay | Admitting: *Deleted

## 2020-10-10 ENCOUNTER — Other Ambulatory Visit: Payer: Self-pay | Admitting: Adult Health

## 2020-10-10 ENCOUNTER — Telehealth: Payer: Self-pay | Admitting: Adult Health

## 2020-10-10 MED ORDER — FLUCONAZOLE 150 MG PO TABS
ORAL_TABLET | ORAL | 1 refills | Status: DC
Start: 2020-10-10 — End: 2020-10-11

## 2020-10-10 NOTE — Telephone Encounter (Signed)
Refilled diflucan

## 2020-10-10 NOTE — Telephone Encounter (Signed)
Pt is requesting a refill on Fluconazole 150 mg tab. Please advise. Thanks!! [place]

## 2020-10-24 ENCOUNTER — Ambulatory Visit: Payer: Medicaid Other | Admitting: Nurse Practitioner

## 2020-10-25 ENCOUNTER — Encounter: Payer: Self-pay | Admitting: Nurse Practitioner

## 2020-10-28 ENCOUNTER — Ambulatory Visit: Payer: Medicaid Other | Admitting: Internal Medicine

## 2020-11-23 ENCOUNTER — Other Ambulatory Visit: Payer: Self-pay

## 2020-11-23 ENCOUNTER — Other Ambulatory Visit (HOSPITAL_COMMUNITY)
Admission: RE | Admit: 2020-11-23 | Discharge: 2020-11-23 | Disposition: A | Discharge status: Home / Self Care | Payer: Medicaid Other | Source: Ambulatory Visit | Attending: Nurse Practitioner | Admitting: Nurse Practitioner

## 2020-11-23 ENCOUNTER — Ambulatory Visit: Payer: Medicaid Other | Admitting: Nurse Practitioner

## 2020-11-23 ENCOUNTER — Encounter: Payer: Self-pay | Admitting: Nurse Practitioner

## 2020-11-23 VITALS — BP 106/73 | HR 80 | Temp 97.6°F | Ht 65.0 in | Wt 131.0 lb

## 2020-11-23 DIAGNOSIS — Z202 Contact with and (suspected) exposure to infections with a predominantly sexual mode of transmission: Secondary | ICD-10-CM

## 2020-11-23 DIAGNOSIS — B9689 Other specified bacterial agents as the cause of diseases classified elsewhere: Secondary | ICD-10-CM

## 2020-11-23 DIAGNOSIS — N911 Secondary amenorrhea: Secondary | ICD-10-CM | POA: Diagnosis not present

## 2020-11-23 DIAGNOSIS — N760 Acute vaginitis: Secondary | ICD-10-CM

## 2020-11-23 DIAGNOSIS — Z7251 High risk heterosexual behavior: Secondary | ICD-10-CM

## 2020-11-23 LAB — URINALYSIS, ROUTINE W REFLEX MICROSCOPIC
Bilirubin, UA: NEGATIVE
Glucose, UA: NEGATIVE
Ketones, UA: NEGATIVE
Nitrite, UA: NEGATIVE
Protein,UA: NEGATIVE
RBC, UA: NEGATIVE
Specific Gravity, UA: >1.03 — ABNORMAL HIGH (ref 1.005–1.030)
Urobilinogen, Ur: 0.2 mg/dL (ref 0.2–1.0)
pH, UA: 6 (ref 5.0–7.5)

## 2020-11-23 LAB — MICROSCOPIC EXAMINATION: Renal Epithel, UA: NONE SEEN /hpf

## 2020-11-23 LAB — PREGNANCY, URINE: Preg Test, Ur: NEGATIVE

## 2020-11-23 LAB — WET PREP FOR TRICH, YEAST, CLUE
Clue Cell Exam: POSITIVE — AB
Trichomonas Exam: NEGATIVE
Yeast Exam: NEGATIVE

## 2020-11-23 MED ORDER — METRONIDAZOLE 500 MG PO TABS: 500.0000 mg | ORAL_TABLET | Freq: Two times a day (BID) | ORAL | 0 refills | Status: DC

## 2020-11-23 MED ORDER — IBUPROFEN 600 MG PO TABS: 600.0000 mg | ORAL_TABLET | Freq: Three times a day (TID) | ORAL | 0 refills | Status: DC | PRN

## 2020-11-23 MED ORDER — CEPHALEXIN 500 MG PO CAPS: 500.0000 mg | ORAL_CAPSULE | Freq: Two times a day (BID) | ORAL | 0 refills | Status: DC

## 2020-11-23 NOTE — Addendum Note (Signed)
Addended by: Ivy Lynn on: 11/23/2020 05:06 PM   Modules accepted: Orders

## 2020-11-23 NOTE — Progress Notes (Signed)
Acute Office Visit  Subjective:    [name], female    DOB: 1991/01/02, 30 y.o.   MRN: [id number]  Chief Complaint  Patient presents with   Vaginal Discharge    Vaginal Discharge The patient's primary symptoms include pelvic pain and vaginal discharge. The patient's pertinent negatives include no vaginal bleeding. This is a new problem. The current episode started in the past 7 days. The problem has been unchanged. The pain is mild. The problem affects both sides. Associated symptoms include chills and nausea. Pertinent negatives include no flank pain or rash. The vaginal discharge was milky. There has been no bleeding. She has not been passing clots. She has not been passing tissue. The symptoms are aggravated by intercourse. She has tried nothing for the symptoms. She is sexually active. It is unknown whether or not her partner has an STD. She uses nothing for contraception. Her menstrual history has been irregular. Her past medical history is significant for ovarian cysts and vaginosis.    Past Medical History:  Diagnosis Date   Abnormal Papanicolaou smear of cervix with positive human papilloma virus (HPV) test 04/17/2018   ASCUS with +HPV, will get colpo    Back pain    BV (bacterial vaginosis) 12/22/2013   Chlamydia 04/17/2018   Contraceptive management 02/18/2014   Irregular bleeding 07/27/2014   LLQ discomfort 07/27/2014   Trichimoniasis    Urinary frequency 07/27/2014   Yeast infection 04/27/2014    Past Surgical History:  Procedure Laterality Date   LAPAROSCOPIC OVARIAN CYSTECTOMY Right 08/25/2014   Procedure: LAPAROSCOPIC OVARIAN CYSTECTOMY;  Surgeon: Florian Buff, MD;  Location: [facility];  Service: Gynecology;  Laterality: Right;    Family History  Problem Relation Age of Onset   Hypertension Mother    Ovarian cysts Mother    Ovarian cysts Sister    Hypertension Maternal Grandmother    Diabetes Maternal Grandmother    Ovarian cysts Maternal Grandmother     Hernia Daughter    Ovarian cysts Maternal Aunt     Social History   Socioeconomic History   Marital status: Single    Spouse name: Not on file   Number of children: 1   Years of education: Not on file   Highest education level: Not on file  Occupational History   Not on file  Tobacco Use   Smoking status: Never   Smokeless tobacco: Never  Vaping Use   Vaping Use: Never used  Substance and Sexual Activity   Alcohol use: Yes   Drug use: No   Sexual activity: Yes    Birth control/protection: None, Condom  Other Topics Concern   Not on file  Social History Narrative   Not on file   Social Determinants of Health   Financial Resource Strain: Not on file  Food Insecurity: Not on file  Transportation Needs: Not on file  Physical Activity: Not on file  Stress: Not on file  Social Connections: Not on file  Intimate Partner Violence: Not on file    Outpatient Medications Prior to Visit  Medication Sig Dispense Refill   fluconazole (DIFLUCAN) 150 MG tablet TAKE 1 TABLET BY MOUTH NOW, THEN TAKE 1 TABLET BY MOUTH IN 3 DAYS IF NEEDED 2 tablet 1   metroNIDAZOLE (METROGEL) 0.75 % vaginal gel INSERT 1 APPLICATORFUL VAGINALLY  AT BEDTIME 70 g 0   No facility-administered medications prior to visit.    Allergies  Allergen Reactions   Bee Venom Shortness Of Breath  and Swelling   Macrobid [Nitrofurantoin Monohyd Macro] Nausea Only    Review of Systems  Constitutional:  Positive for chills.  Respiratory: Negative.    Cardiovascular: Negative.   Gastrointestinal:  Positive for nausea.  Genitourinary:  Positive for pelvic pain and vaginal discharge. Negative for flank pain.  Skin:  Negative for rash.      Objective:    Physical Exam Vitals and nursing note reviewed.  Constitutional:      Appearance: Normal appearance.  HENT:     Head: Normocephalic.     Mouth/Throat:     Mouth: Mucous membranes are moist.     Pharynx: Oropharynx is clear.  Eyes:      Conjunctiva/sclera: Conjunctivae normal.  Cardiovascular:     Rate and Rhythm: Normal rate and regular rhythm.     Pulses: Normal pulses.     Heart sounds: Normal heart sounds.  Pulmonary:     Breath sounds: Normal breath sounds.  Abdominal:     General: Bowel sounds are normal.  Genitourinary:    Vagina: Vaginal discharge present.  Skin:    Findings: No rash.  Neurological:     Mental Status: She is alert and oriented to person, place, and time.  Psychiatric:        Behavior: Behavior normal.    BP 106/73   Pulse 80   Temp 97.6 F (36.4 C)   Ht _0  (1.651 m)   Wt 131 lb (59.4 kg)   BMI 21.80 kg/m  Wt Readings from Last 3 Encounters:  11/23/20 131 lb (59.4 kg)  08/29/20 130 lb (59 kg)  09/29/19 128 lb (58.1 kg)    Health Maintenance Due  Topic Date Due   COVID-19 Vaccine (1) Never done   INFLUENZA VACCINE  10/17/2020    There are no preventive care reminders to display for this patient.   Lab Results  Component Value Date   TSH 0.801 01/09/2016   Lab Results  Component Value Date   WBC 8.6 08/29/2020   HGB 14.0 08/29/2020   HCT 42.6 08/29/2020   MCV 100 (H) 08/29/2020   PLT 184 08/29/2020   Lab Results  Component Value Date   NA 143 08/29/2020   K 4.5 08/29/2020   CO2 24 08/29/2020   GLUCOSE 74 08/29/2020   BUN 11 08/29/2020   CREATININE 0.89 08/29/2020   BILITOT 0.3 08/29/2020   ALKPHOS 45 08/29/2020   AST 12 08/29/2020   ALT 9 08/29/2020   PROT 7.0 08/29/2020   ALBUMIN 4.4 08/29/2020   CALCIUM 9.9 08/29/2020   ANIONGAP 6 11/05/2016   EGFR 91 08/29/2020   Lab Results  Component Value Date   CHOL 135 08/29/2020   Lab Results  Component Value Date   HDL 50 08/29/2020   Lab Results  Component Value Date   LDLCALC 67 08/29/2020   Lab Results  Component Value Date   TRIG 98 08/29/2020   Lab Results  Component Value Date   CHOLHDL 2.7 08/29/2020   No results found for: HGBA1C     Assessment & Plan:   Problem List Items  Addressed This Visit       Other   Exposure to sexually transmitted disease (STD) - Primary    Exposed to sexually transmitted disease from unprotected sex.  Last menstrual period August 19.  Completed urine pregnancy test results pending and STD testing.   Education provided to patient printed handouts given.  Ibuprofen/Tylenol for pain as tolerated.  Relevant Orders   Urinalysis, Routine w reflex microscopic   Urine cytology ancillary only   Unprotected sex    Completed urine pregnancy test results pending.  And STI testing.      Relevant Orders   HSV I/II IgM Rflx I/II Type Sp   POCT urine pregnancy   HIV antibody (with reflex)     Meds ordered this encounter  Medications   ibuprofen (ADVIL) 600 MG tablet    Sig: Take 1 tablet (600 mg total) by mouth every 8 (eight) hours as needed.    Dispense:  30 tablet    Refill:  0    Order Specific Question:   Supervising Provider    Answer:   Janora Norlander [[id number]]     Ivy Lynn, NP

## 2020-11-23 NOTE — Assessment & Plan Note (Signed)
Completed urine pregnancy test results pending.  And STI testing.

## 2020-11-23 NOTE — Assessment & Plan Note (Signed)
Exposed to sexually transmitted disease from unprotected sex.  Last menstrual period August 19.  Completed urine pregnancy test results pending and STD testing.   Education provided to patient printed handouts given.  Ibuprofen/Tylenol for pain as tolerated.

## 2020-11-24 LAB — HIV ANTIBODY (ROUTINE TESTING W REFLEX): HIV Screen 4th Generation wRfx: NONREACTIVE

## 2020-11-24 LAB — HSV I/II IGM RFLX I/II TYPE SP: HSVI/II Comb IgM: 0.91 Ratio — ABNORMAL HIGH (ref 0.00–0.90)

## 2020-11-25 LAB — URINE CYTOLOGY ANCILLARY ONLY
Chlamydia: NEGATIVE
Comment: NEGATIVE
Comment: NEGATIVE
Comment: NORMAL
Neisseria Gonorrhea: NEGATIVE
Trichomonas: NEGATIVE

## 2020-11-28 ENCOUNTER — Ambulatory Visit: Payer: Medicaid Other | Admitting: Nurse Practitioner

## 2020-11-28 ENCOUNTER — Encounter: Payer: Self-pay | Admitting: Nurse Practitioner

## 2021-03-21 ENCOUNTER — Encounter: Payer: Self-pay | Admitting: Nurse Practitioner

## 2021-04-03 ENCOUNTER — Other Ambulatory Visit: Payer: Self-pay | Admitting: Nurse Practitioner

## 2021-04-03 DIAGNOSIS — Z7689 Persons encountering health services in other specified circumstances: Secondary | ICD-10-CM

## 2021-04-20 ENCOUNTER — Other Ambulatory Visit: Payer: Self-pay

## 2021-04-20 ENCOUNTER — Ambulatory Visit (INDEPENDENT_AMBULATORY_CARE_PROVIDER_SITE_OTHER): Payer: Medicaid Other | Admitting: Adult Health

## 2021-04-20 ENCOUNTER — Encounter: Payer: Self-pay | Admitting: Adult Health

## 2021-04-20 ENCOUNTER — Other Ambulatory Visit (HOSPITAL_COMMUNITY)
Admission: RE | Admit: 2021-04-20 | Discharge: 2021-04-20 | Disposition: A | Discharge status: Home / Self Care | Payer: Medicaid Other | Source: Ambulatory Visit | Attending: Adult Health | Admitting: Adult Health

## 2021-04-20 VITALS — BP 104/67 | HR 81 | Ht 66.0 in | Wt 141.0 lb

## 2021-04-20 DIAGNOSIS — Z0000 Encounter for general adult medical examination without abnormal findings: Secondary | ICD-10-CM

## 2021-04-20 DIAGNOSIS — Z01419 Encounter for gynecological examination (general) (routine) without abnormal findings: Secondary | ICD-10-CM

## 2021-04-20 NOTE — Progress Notes (Signed)
[name], female   DOB: Jul 08, 1990, 31 y.o.   MRN: [id number] History of Present Illness: Kayla is a 31 year old black female, single, G1P0101, in for well woman gyn exam and requests pap. She has had some bumps over body with itching,none now, has had eczema. PCP is Kayla Nap NP.   Current Medications, Allergies, Past Medical History, Past Surgical History, Family History and Social History were reviewed in [employer] record.     Review of Systems:  Patient denies any headaches, hearing loss, fatigue, blurred vision, shortness of breath, chest pain, abdominal pain, problems with bowel movements, urination, or intercourse. No joint pain or mood swings.  +bumps that itch at times, none now, tyr good body lotion   Physical Exam:BP 104/67 (BP Location: Left Arm, Patient Position: Sitting, Cuff Size: Normal)    Pulse 81    Ht 5\' 6"  (1.676 m)    Wt 141 lb (64 kg)    LMP 04/07/2021    BMI 22.76 kg/m   General:  Well developed, well nourished, no acute distress Skin:  Warm and dry,no bumps has some dark areas  Neck:  Midline trachea, normal thyroid, good ROM, no lymphadenopathy Lungs; Clear to auscultation bilaterally Breast:  No dominant palpable mass, retraction, or nipple discharge Cardiovascular: Regular rate and rhythm Abdomen:  Soft, non tender, no hepatosplenomegaly Pelvic:  External genitalia is normal in appearance, no lesions.  The vagina is normal in appearance.Has creamy discharge without odor.  Urethra has no lesions or masses. The cervix is bulbous.Pap with GC/CHL and HR HPV genotyping performed.  Uterus is felt to be normal size, shape, and contour.  No adnexal masses or tenderness noted.Bladder is non tender, no masses felt. Rectal: Deferred Extremities/musculoskeletal:  No swelling or varicosities noted, no clubbing or cyanosis Psych:  No mood changes, alert and cooperative,seems happy AA is 0  Fall risk is low Depression screen [facility]  2/9 04/20/2021 08/29/2020 06/11/2019  Decreased Interest 0 0 0  Down, Depressed, Hopeless 0 0 0  PHQ - 2 Score 0 0 0  Altered sleeping 0 0 -  Tired, decreased energy 0 0 -  Change in appetite 0 0 -  Feeling bad or failure about yourself  0 0 -  Trouble concentrating 0 0 -  Moving slowly or fidgety/restless 0 0 -  Suicidal thoughts 0 0 -  PHQ-9 Score 0 0 -  Difficult doing work/chores - Not difficult at all -    GAD 7 : Generalized Anxiety Score 04/20/2021 08/29/2020  Nervous, Anxious, on Edge 0 0  Control/stop worrying 0 0  Worry too much - different things 0 0  Trouble relaxing 0 0  Restless 0 0  Easily annoyed or irritable 0 0  Afraid - awful might happen 0 0  Total GAD 7 Score 0 0  Anxiety Difficulty - Not difficult at all    Upstream - 04/20/21 1517       Pregnancy Intention Screening   Does the patient want to become pregnant in the next year? No    Does the patient's partner want to become pregnant in the next year? No    Would the patient like to discuss contraceptive options today? No      Contraception Wrap Up   Current Method Female Condom    End Method Female Condom              Examination chaperoned by [name] LPN  Impression and Plan: 1.  Routine general medical examination at a health care facility Pap sent   2. Encounter for gynecological examination with Papanicolaou smear of cervix Pap sent Physical in 1 year Pap in 3 years if normal

## 2021-04-24 LAB — CYTOLOGY - PAP
Chlamydia: NEGATIVE
Comment: NEGATIVE
Comment: NEGATIVE
Comment: NEGATIVE
Comment: NORMAL
Diagnosis: NEGATIVE
HPV 16: NEGATIVE
HPV 18 / 45: NEGATIVE
High risk HPV: POSITIVE — AB
Neisseria Gonorrhea: NEGATIVE

## 2021-04-26 ENCOUNTER — Other Ambulatory Visit: Payer: Self-pay | Admitting: Adult Health

## 2021-04-26 ENCOUNTER — Telehealth: Payer: Self-pay | Admitting: Adult Health

## 2021-04-26 MED ORDER — METRONIDAZOLE 0.75 % VA GEL: 1.0000 | Freq: Every day | VAGINAL | 0 refills | Status: DC

## 2021-04-26 NOTE — Progress Notes (Signed)
Refilled metrogel at [facility]

## 2021-04-26 NOTE — Telephone Encounter (Signed)
Patient is requesting a refill on metrogel for bv. Please advise.

## 2021-07-03 ENCOUNTER — Encounter: Payer: Medicaid Other | Admitting: Nurse Practitioner

## 2021-07-10 ENCOUNTER — Encounter: Payer: Self-pay | Admitting: Nurse Practitioner

## 2021-07-10 ENCOUNTER — Ambulatory Visit: Payer: Medicaid Other

## 2021-08-24 ENCOUNTER — Other Ambulatory Visit: Payer: Self-pay | Admitting: Adult Health

## 2021-09-12 ENCOUNTER — Other Ambulatory Visit (HOSPITAL_COMMUNITY)
Admission: RE | Admit: 2021-09-12 | Discharge: 2021-09-12 | Disposition: A | Discharge status: Home / Self Care | Payer: Medicaid Other | Source: Ambulatory Visit | Attending: Obstetrics & Gynecology | Admitting: Obstetrics & Gynecology

## 2021-09-12 ENCOUNTER — Other Ambulatory Visit (INDEPENDENT_AMBULATORY_CARE_PROVIDER_SITE_OTHER): Payer: Medicaid Other | Admitting: *Deleted

## 2021-09-12 DIAGNOSIS — Z113 Encounter for screening for infections with a predominantly sexual mode of transmission: Secondary | ICD-10-CM | POA: Insufficient documentation

## 2021-09-12 DIAGNOSIS — R109 Unspecified abdominal pain: Secondary | ICD-10-CM | POA: Insufficient documentation

## 2021-09-12 DIAGNOSIS — N898 Other specified noninflammatory disorders of vagina: Secondary | ICD-10-CM | POA: Diagnosis not present

## 2021-09-13 ENCOUNTER — Other Ambulatory Visit: Payer: Self-pay | Admitting: Adult Health

## 2021-09-13 LAB — HIV ANTIBODY (ROUTINE TESTING W REFLEX): HIV Screen 4th Generation wRfx: NONREACTIVE

## 2021-09-13 LAB — CERVICOVAGINAL ANCILLARY ONLY
Bacterial Vaginitis (gardnerella): NEGATIVE
Candida Glabrata: NEGATIVE
Candida Vaginitis: POSITIVE — AB
Chlamydia: NEGATIVE
Comment: NEGATIVE
Comment: NEGATIVE
Comment: NEGATIVE
Comment: NEGATIVE
Comment: NEGATIVE
Comment: NORMAL
Neisseria Gonorrhea: NEGATIVE
Trichomonas: NEGATIVE

## 2021-09-13 LAB — RPR: RPR Ser Ql: NONREACTIVE

## 2021-09-13 MED ORDER — FLUCONAZOLE 150 MG PO TABS: ORAL_TABLET | ORAL | 1 refills | Status: DC

## 2021-10-11 ENCOUNTER — Other Ambulatory Visit: Payer: Self-pay | Admitting: Adult Health

## 2021-11-30 ENCOUNTER — Other Ambulatory Visit (HOSPITAL_COMMUNITY)
Admission: RE | Admit: 2021-11-30 | Discharge: 2021-11-30 | Disposition: A | Discharge status: Home / Self Care | Payer: Medicaid Other | Source: Ambulatory Visit | Attending: Obstetrics & Gynecology | Admitting: Obstetrics & Gynecology

## 2021-11-30 ENCOUNTER — Other Ambulatory Visit (INDEPENDENT_AMBULATORY_CARE_PROVIDER_SITE_OTHER): Payer: Medicaid Other | Admitting: *Deleted

## 2021-11-30 DIAGNOSIS — N898 Other specified noninflammatory disorders of vagina: Secondary | ICD-10-CM | POA: Diagnosis present

## 2021-11-30 NOTE — Progress Notes (Signed)
   NURSE VISIT- VAGINITIS/STD  SUBJECTIVE:  Kayla Nicholson is a 31 y.o. G1P0101 GYN patientfemale here for a vaginal swab for vaginitis screening, STD screen.  She reports the following symptoms:  vaginal odor and irritation  since this am.  Denies abnormal vaginal bleeding, significant pelvic pain, fever, or UTI symptoms.  OBJECTIVE:  There were no vitals taken for this visit.  Appears well, in no apparent distress  ASSESSMENT: Vaginal swab for vaginitis screening & STD screening  PLAN: Self-collected vaginal probe for Gonorrhea, Chlamydia, Trichomonas, Bacterial Vaginosis, Yeast sent to lab Treatment: to be determined once results are received; if needs med for BV, pt would rather have pill instead of gel.  Follow-up as needed if symptoms persist/worsen, or new symptoms develop  [name]  11/30/2021 3:21 PM

## 2021-12-04 ENCOUNTER — Other Ambulatory Visit: Payer: Self-pay | Admitting: Adult Health

## 2021-12-04 LAB — CERVICOVAGINAL ANCILLARY ONLY
Bacterial Vaginitis (gardnerella): POSITIVE — AB
Candida Glabrata: NEGATIVE
Candida Vaginitis: NEGATIVE
Chlamydia: NEGATIVE
Comment: NEGATIVE
Comment: NEGATIVE
Comment: NEGATIVE
Comment: NEGATIVE
Comment: NEGATIVE
Comment: NORMAL
Neisseria Gonorrhea: NEGATIVE
Trichomonas: NEGATIVE

## 2021-12-04 MED ORDER — METRONIDAZOLE 500 MG PO TABS: 500.0000 mg | ORAL_TABLET | Freq: Two times a day (BID) | ORAL | 0 refills | Status: DC

## 2021-12-04 NOTE — Progress Notes (Signed)
+  BV on vaginal swab, rx sent for flagyl,no sex or alcohol while taking

## 2022-02-06 ENCOUNTER — Other Ambulatory Visit: Payer: Medicaid Other

## 2022-02-06 ENCOUNTER — Telehealth: Payer: Self-pay

## 2022-02-06 ENCOUNTER — Telehealth: Payer: Medicaid Other | Admitting: Physician Assistant

## 2022-02-06 DIAGNOSIS — B3731 Acute candidiasis of vulva and vagina: Secondary | ICD-10-CM | POA: Diagnosis not present

## 2022-02-06 NOTE — Telephone Encounter (Signed)
Patient came in for a nurse visit for a self swab, patient left the office with  out being seen. She was only here for 15 to 20 minutes, she had not been gone 2 minutes when the nurse came to get her. She called the office and wanted something to be sent to pharmacy. Patient was informed to come back for the nurse visit for the self swab. Patient refused, then they offered her tomorrow and Monday since the office will be closed Thursday and Friday. I tired to explain to the patient she had to be seen in order for the provider to send in medicine. Patient wanted to make a complaint and fax over information that Anderson Malta has sent in scripts before without being seen. I try to explain to patient I did not see that. And patient hung up.

## 2022-02-07 MED ORDER — FLUCONAZOLE 150 MG PO TABS: 150.0000 mg | ORAL_TABLET | Freq: Once | ORAL | 0 refills | Status: DC

## 2022-02-07 MED ORDER — FLUCONAZOLE 150 MG PO TABS: ORAL_TABLET | ORAL | 0 refills | Status: DC

## 2022-02-07 NOTE — Progress Notes (Signed)
I have spent 5 minutes in review of e-visit questionnaire, review and updating patient chart, medical decision making and response to patient.   Letty Salvi Cody Milany Geck, PA-C    

## 2022-02-07 NOTE — Progress Notes (Signed)

## 2022-02-07 NOTE — Addendum Note (Signed)
Addended by: Brunetta Jeans on: 02/07/2022 07:36 AM   Modules accepted: Orders

## 2022-02-12 ENCOUNTER — Ambulatory Visit: Payer: Medicaid Other | Admitting: Adult Health

## 2022-06-19 ENCOUNTER — Encounter: Payer: Self-pay | Admitting: Nurse Practitioner

## 2022-06-19 ENCOUNTER — Encounter: Payer: Medicaid Other | Admitting: Nurse Practitioner

## 2022-06-21 ENCOUNTER — Other Ambulatory Visit (INDEPENDENT_AMBULATORY_CARE_PROVIDER_SITE_OTHER): Payer: Medicaid Other | Admitting: *Deleted

## 2022-06-21 ENCOUNTER — Other Ambulatory Visit (HOSPITAL_COMMUNITY)
Admission: RE | Admit: 2022-06-21 | Discharge: 2022-06-21 | Disposition: A | Discharge status: Home / Self Care | Payer: Medicaid Other | Source: Ambulatory Visit | Attending: Obstetrics & Gynecology | Admitting: Obstetrics & Gynecology

## 2022-06-21 DIAGNOSIS — R300 Dysuria: Secondary | ICD-10-CM | POA: Diagnosis not present

## 2022-06-21 DIAGNOSIS — Z113 Encounter for screening for infections with a predominantly sexual mode of transmission: Secondary | ICD-10-CM | POA: Insufficient documentation

## 2022-06-21 LAB — POCT URINALYSIS DIPSTICK OB
Glucose, UA: NEGATIVE
Ketones, UA: NEGATIVE
Nitrite, UA: NEGATIVE
POC,PROTEIN,UA: NEGATIVE

## 2022-06-21 NOTE — Progress Notes (Signed)
   NURSE VISIT- UTI SYMPTOMS   SUBJECTIVE:  Kayla Nicholson is a 32 y.o. G65P0101 female here for UTI symptoms. She is a GYN patient. She reports dysuria.  OBJECTIVE:  There were no vitals taken for this visit.  Appears well, in no apparent distress  Results for orders placed or performed in visit on 06/21/22 (from the past 24 hour(s))  POC Urinalysis Dipstick OB   Collection Time: 06/21/22 11:01 AM  Result Value Ref Range   Color, UA     Clarity, UA     Glucose, UA Negative Negative   Bilirubin, UA     Ketones, UA neg    Spec Grav, UA     Blood, UA trace    pH, UA     POC,PROTEIN,UA Negative Negative, Trace, Small (1+), Moderate (2+), Large (3+), 4+   Urobilinogen, UA     Nitrite, UA neg    Leukocytes, UA Small (1+) (A) Negative   Appearance     Odor      ASSESSMENT: GYN patient with UTI symptoms and negative nitrites  PLAN: Note routed to Derrek Monaco, AGNP   Rx sent by provider today: No Urine culture sent Call or return to clinic prn if these symptoms worsen or fail to improve as anticipated. Follow-up: as needed  Also collected self swab for bv, yeast, trich, gonorrhea and chlamydia.   Kayla Nicholson  06/21/2022 11:02 AM

## 2022-06-22 ENCOUNTER — Telehealth: Payer: Self-pay

## 2022-06-22 ENCOUNTER — Other Ambulatory Visit: Payer: Self-pay | Admitting: Adult Health

## 2022-06-22 LAB — URINALYSIS
Bilirubin, UA: NEGATIVE
Glucose, UA: NEGATIVE
Ketones, UA: NEGATIVE
Nitrite, UA: NEGATIVE
RBC, UA: NEGATIVE
Specific Gravity, UA: 1.024 (ref 1.005–1.030)
Urobilinogen, Ur: 0.2 mg/dL (ref 0.2–1.0)
pH, UA: 7.5 (ref 5.0–7.5)

## 2022-06-22 LAB — CERVICOVAGINAL ANCILLARY ONLY
Bacterial Vaginitis (gardnerella): POSITIVE — AB
Candida Glabrata: NEGATIVE
Candida Vaginitis: NEGATIVE
Chlamydia: NEGATIVE
Comment: NEGATIVE
Comment: NEGATIVE
Comment: NEGATIVE
Comment: NEGATIVE
Comment: NEGATIVE
Comment: NORMAL
Neisseria Gonorrhea: NEGATIVE
Trichomonas: NEGATIVE

## 2022-06-22 MED ORDER — SULFAMETHOXAZOLE-TRIMETHOPRIM 800-160 MG PO TABS: 1.0000 | ORAL_TABLET | Freq: Two times a day (BID) | ORAL | 0 refills | Status: DC

## 2022-06-22 NOTE — Progress Notes (Signed)
Rx septra ds 

## 2022-06-22 NOTE — Telephone Encounter (Signed)
I attempted to return patient's call at [phone].  Message received "call cannot be completed at this time".

## 2022-06-22 NOTE — Telephone Encounter (Signed)
Patient called and wants her test results and medication called in. Patient is angry and wants someone to call her right now.

## 2022-06-23 LAB — URINE CULTURE

## 2022-06-25 ENCOUNTER — Other Ambulatory Visit: Payer: Self-pay | Admitting: Adult Health

## 2022-06-25 MED ORDER — METRONIDAZOLE 500 MG PO TABS: 500.0000 mg | ORAL_TABLET | Freq: Two times a day (BID) | ORAL | 0 refills | Status: DC

## 2022-06-27 ENCOUNTER — Other Ambulatory Visit: Payer: Self-pay | Admitting: Adult Health

## 2022-06-27 MED ORDER — METRONIDAZOLE 0.75 % VA GEL: 1.0000 | Freq: Every day | VAGINAL | 0 refills | Status: DC

## 2022-06-27 NOTE — Progress Notes (Signed)
Will rx metrogel 

## 2022-07-10 ENCOUNTER — Other Ambulatory Visit: Payer: Self-pay | Admitting: Adult Health

## 2022-07-10 DIAGNOSIS — B3731 Acute candidiasis of vulva and vagina: Secondary | ICD-10-CM

## 2022-07-10 MED ORDER — FLUCONAZOLE 150 MG PO TABS: ORAL_TABLET | ORAL | 1 refills | Status: DC

## 2022-07-10 NOTE — Progress Notes (Signed)
Refilled diflucan 

## 2022-07-13 ENCOUNTER — Ambulatory Visit: Payer: Medicaid Other | Admitting: Advanced Practice Midwife

## 2022-08-01 ENCOUNTER — Encounter: Payer: Medicaid Other | Admitting: Obstetrics and Gynecology

## 2022-12-12 ENCOUNTER — Encounter: Payer: Medicaid Other | Admitting: Women's Health

## 2023-01-01 ENCOUNTER — Encounter: Payer: Medicaid Other | Admitting: Obstetrics & Gynecology

## 2023-01-02 ENCOUNTER — Encounter: Payer: Medicaid Other | Admitting: Women's Health

## 2023-01-02 ENCOUNTER — Encounter: Payer: Self-pay | Admitting: Women's Health

## 2023-01-02 VITALS — BP 112/75 | HR 107 | Ht 66.0 in | Wt 128.0 lb

## 2023-01-02 NOTE — Progress Notes (Unsigned)
   COLPOSCOPY PROCEDURE NOTE- **NOT DONE** Patient name: Kayla Nicholson MRN [id number]  Date of birth: [birth year] Subjective Findings:   Kayla Nicholson is a 32 y.o. G54P0101 African American female being seen today for a colposcopy. Indication: Abnormal pap on 10/23/22: LSIL w/ HRHPV positive: type not specified  Prior cytology:  Date Result Procedure  04/20/21 NILM w/ HRHPV positive: other (not 16, 18/45) None  06/11/19 NILM w/ HRHPV negative None  2020 ASCUS w/ HRHPV positive: type not specified None  2017 NILM w/ HRHPV not done None   Went in to see pt for colpo- pt was not in room and I was told by front desk that she walked out. Per Peggy's visit notes she was not sure she wanted to do the colposcopy.   I sent Kayla the following MyChart message regarding the importance of colposcopy and calling to reschedule her appointment:  Elvina Sidle Kayla, I just went into see you to do your colposcopy a few minutes ago and you were not in the room. I was told you had left. Peggy said you told her you weren't sure you wanted to do the colposcopy. With your most recent pap smear (LSIL-which means low grade changes and positive high-risk HPV-which is the virus that can potentially lead to cervical cancer) your immediate risk of having high grade (precancerous) changes to your cervix is 5%. The colposcopy allows [place] to take a closer look at your cervix with a microscope, and then we apply a vinegar solution to see if we see any white changes to your cervix, and if we do- we get a sample from where we see those changes and send to the lab for them to evaluate. Even if we do not see any white changes sometimes we will send a sample just to make sur we're not missing anything. Please let me know if you have any questions and please call back [phone]) to reschedule this important procedure. Thanks, Margaretmary Bayley CNM

## 2023-06-10 ENCOUNTER — Other Ambulatory Visit (HOSPITAL_COMMUNITY)
Admission: RE | Admit: 2023-06-10 | Discharge: 2023-06-10 | Disposition: A | Discharge status: Home / Self Care | Source: Ambulatory Visit | Attending: Obstetrics & Gynecology | Admitting: Obstetrics & Gynecology

## 2023-06-10 ENCOUNTER — Ambulatory Visit: Admitting: *Deleted

## 2023-06-10 DIAGNOSIS — N898 Other specified noninflammatory disorders of vagina: Secondary | ICD-10-CM | POA: Diagnosis present

## 2023-06-10 DIAGNOSIS — R3915 Urgency of urination: Secondary | ICD-10-CM | POA: Insufficient documentation

## 2023-06-10 DIAGNOSIS — R350 Frequency of micturition: Secondary | ICD-10-CM | POA: Insufficient documentation

## 2023-06-10 LAB — POCT URINALYSIS DIPSTICK OB
Blood, UA: NEGATIVE
Glucose, UA: NEGATIVE
Ketones, UA: NEGATIVE
Leukocytes, UA: NEGATIVE
Nitrite, UA: NEGATIVE

## 2023-06-10 NOTE — Progress Notes (Signed)
   NURSE VISIT- VAGINITIS/UTI  SUBJECTIVE:  Kayla Nicholson is a 33 y.o. G1P0101 GYN patientfemale here for a vaginal swab for vaginitis screening.  She reports the following symptoms: discharge described as white, odor, and urinary symptoms of urinary frequency and urinary urgency for 1 week. Denies abnormal vaginal bleeding, significant pelvic pain, fever.  OBJECTIVE:  There were no vitals taken for this visit.  Appears well, in no apparent distress  ASSESSMENT: Vaginal swab for vaginitis screening  PLAN: Self-collected vaginal probe for Gonorrhea, Chlamydia, Trichomonas, Bacterial Vaginosis, Yeast sent to lab Urine cx/UA sent Treatment: to be determined once results are received Follow-up as needed if symptoms persist/worsen, or new symptoms develop  Jobe Marker  06/10/2023 11:39 AM

## 2023-06-11 ENCOUNTER — Telehealth: Payer: Self-pay | Admitting: *Deleted

## 2023-06-11 ENCOUNTER — Other Ambulatory Visit: Payer: Self-pay | Admitting: Adult Health

## 2023-06-11 LAB — CERVICOVAGINAL ANCILLARY ONLY
Bacterial Vaginitis (gardnerella): POSITIVE — AB
Candida Glabrata: NEGATIVE
Candida Vaginitis: NEGATIVE
Chlamydia: NEGATIVE
Comment: NEGATIVE
Comment: NEGATIVE
Comment: NEGATIVE
Comment: NEGATIVE
Comment: NEGATIVE
Comment: NORMAL
Neisseria Gonorrhea: NEGATIVE
Trichomonas: NEGATIVE

## 2023-06-11 LAB — URINALYSIS, ROUTINE W REFLEX MICROSCOPIC
Bilirubin, UA: NEGATIVE
Glucose, UA: NEGATIVE
Ketones, UA: NEGATIVE
Nitrite, UA: NEGATIVE
Protein,UA: NEGATIVE
RBC, UA: NEGATIVE
Specific Gravity, UA: >=1.03 — AB (ref 1.005–1.030)
Urobilinogen, Ur: 0.2 mg/dL (ref 0.2–1.0)
pH, UA: 5.5 (ref 5.0–7.5)

## 2023-06-11 LAB — MICROSCOPIC EXAMINATION: Casts: NONE SEEN /LPF

## 2023-06-11 MED ORDER — METRONIDAZOLE 0.75 % VA GEL: 1.0000 | Freq: Every day | VAGINAL | 0 refills | Status: DC

## 2023-06-11 MED ORDER — SULFAMETHOXAZOLE-TRIMETHOPRIM 800-160 MG PO TABS: 1.0000 | ORAL_TABLET | Freq: Two times a day (BID) | ORAL | 0 refills | Status: DC

## 2023-06-11 NOTE — Telephone Encounter (Signed)
 Rx sent in for septra ds

## 2023-06-11 NOTE — Telephone Encounter (Signed)
 Patient called stating she received her test results and is waiting on medication to be sent to her pharmacy.  States she also wants to go ahead and be treated for an UTI since she has symptoms and has a history of having them in the past.  Informed medication may need to be changed based on results and pt verbalized understanding and ok with getting a new script if needed.  Advised provider would check results at end of day and for patient to check back with her pharmacy later this afternoon.

## 2023-06-12 LAB — URINE CULTURE

## 2023-06-25 ENCOUNTER — Ambulatory Visit: Admitting: Obstetrics & Gynecology

## 2023-07-08 ENCOUNTER — Ambulatory Visit: Admitting: Obstetrics & Gynecology

## 2023-07-10 ENCOUNTER — Ambulatory Visit: Admitting: Obstetrics & Gynecology

## 2023-08-20 ENCOUNTER — Ambulatory Visit: Admitting: Obstetrics and Gynecology

## 2023-08-21 ENCOUNTER — Encounter: Payer: Self-pay | Admitting: Advanced Practice Midwife

## 2023-08-21 ENCOUNTER — Ambulatory Visit (INDEPENDENT_AMBULATORY_CARE_PROVIDER_SITE_OTHER): Admitting: Advanced Practice Midwife

## 2023-08-21 ENCOUNTER — Other Ambulatory Visit (HOSPITAL_COMMUNITY)
Admission: RE | Admit: 2023-08-21 | Discharge: 2023-08-21 | Disposition: A | Discharge status: Home / Self Care | Source: Ambulatory Visit | Attending: Advanced Practice Midwife | Admitting: Advanced Practice Midwife

## 2023-08-21 VITALS — BP 112/75 | Ht 66.0 in | Wt 152.0 lb

## 2023-08-21 DIAGNOSIS — Z124 Encounter for screening for malignant neoplasm of cervix: Secondary | ICD-10-CM | POA: Insufficient documentation

## 2023-08-21 DIAGNOSIS — Z113 Encounter for screening for infections with a predominantly sexual mode of transmission: Secondary | ICD-10-CM | POA: Diagnosis not present

## 2023-08-21 DIAGNOSIS — R87612 Low grade squamous intraepithelial lesion on cytologic smear of cervix (LGSIL): Secondary | ICD-10-CM | POA: Diagnosis not present

## 2023-08-21 DIAGNOSIS — Z8742 Personal history of other diseases of the female genital tract: Secondary | ICD-10-CM

## 2023-08-21 DIAGNOSIS — Z01419 Encounter for gynecological examination (general) (routine) without abnormal findings: Secondary | ICD-10-CM | POA: Diagnosis present

## 2023-08-21 DIAGNOSIS — R87810 Cervical high risk human papillomavirus (HPV) DNA test positive: Secondary | ICD-10-CM | POA: Insufficient documentation

## 2023-08-21 DIAGNOSIS — Z1151 Encounter for screening for human papillomavirus (HPV): Secondary | ICD-10-CM | POA: Diagnosis not present

## 2023-08-21 NOTE — Progress Notes (Signed)
 WELL-WOMAN EXAMINATION Patient name: Kayla Nicholson MRN [id number]  Date of birth: 01/12/1991 Chief Complaint:   No chief complaint on file.  History of Present Illness:   Kayla Nicholson is a 33 y.o. G79P0101 African-American female being seen today for a routine well-woman exam.  Current complaints: not using contraception; okay with pregnancy; doing well; desires STD screening.  PCP: [facility]      does desire labs (STD screening) Patient's last menstrual period was 08/14/2023 (exact date). The current method of family planning is condoms.  Last pap Feb 2023. Results were: NILM w/ HRHPV positive: other (not 16, 18/45). H/O abnormal pap: yes (+HPV) Last mammogram: never. Results were: N/A. Family h/o breast cancer: no Last colonoscopy: never. Results were: N/A. Family h/o colorectal cancer: no     08/21/2023    2:02 PM 04/20/2021    8:58 AM 08/29/2020   12:23 PM 06/11/2019    1:36 PM 06/04/2018    9:01 AM  Depression screen PHQ 2/9  Decreased Interest 0 0 0 0 0  Down, Depressed, Hopeless 0 0 0 0 0  PHQ - 2 Score 0 0 0 0 0  Altered sleeping 0 0 0    Tired, decreased energy 0 0 0    Change in appetite 0 0 0    Feeling bad or failure about yourself  0 0 0    Trouble concentrating 0 0 0    Moving slowly or fidgety/restless 0 0 0    Suicidal thoughts 0 0 0    PHQ-9 Score 0 0 0    Difficult doing work/chores   Not difficult at all          08/21/2023    2:02 PM 04/20/2021    8:58 AM 08/29/2020   12:23 PM  GAD 7 : Generalized Anxiety Score  Nervous, Anxious, on Edge 0 0 0  Control/stop worrying 0 0 0  Worry too much - different things 0 0 0  Trouble relaxing 0 0 0  Restless 0 0 0  Easily annoyed or irritable 0 0 0  Afraid - awful might happen 0 0 0  Total GAD 7 Score 0 0 0  Anxiety Difficulty   Not difficult at all     Review of Systems:   Pertinent items are noted in HPI Denies any headaches, blurred vision, fatigue, shortness of breath, chest pain, abdominal pain, abnormal  vaginal discharge/itching/odor/irritation, problems with periods, bowel movements, urination, or intercourse unless otherwise stated above. Pertinent History Reviewed:  Reviewed past medical,surgical, social and family history.  Reviewed problem list, medications and allergies. Physical Assessment:   Vitals:   08/21/23 1352  BP: 112/75  Weight: 152 lb (68.9 kg)  Height: 5\' 6"  (1.676 m)  Body mass index is 24.53 kg/m.        Physical Examination:   General appearance - well appearing, and in no distress  Mental status - alert, oriented to person, place, and time  Psych:  She has a normal mood and affect  Skin - warm and dry, normal color, no suspicious lesions noted  Chest - effort normal, all lung fields clear to auscultation bilaterally  Heart - normal rate and regular rhythm  Neck:  midline trachea, no thyromegaly or nodules  Breasts - breasts appear normal, no suspicious masses, no skin or nipple changes or  axillary nodes  Abdomen - soft, nontender, nondistended, no masses or organomegaly  Pelvic - VULVA: normal appearing vulva with no masses, tenderness or lesions  VAGINA: normal appearing vagina with normal color and discharge, no lesions  CERVIX: normal appearing cervix without discharge or lesions, no CMT  Thin prep pap is done with HR HPV cotesting  UTERUS: uterus is felt to be normal size, shape, consistency and nontender   ADNEXA: No adnexal masses or tenderness noted.  Rectal - not examined  Extremities:  No swelling or varicosities noted  Chaperone: Wendell Halt  No results found for this or any previous visit (from the past 24 hours).  Assessment & Plan:  1) Well-Woman Exam  2) Hx Pap w +HRHPV, repeat today  Labs/procedures today: Pap, GC/chlam, HIV, RPR Hep  Mammogram: @ 32yo, or sooner if problems Colonoscopy: @ 32yo, or sooner if problems  Orders Placed This Encounter  Procedures   HIV Antibody (routine testing w rflx)   RPR   Hepatitis C Antibody     Meds: No orders of the defined types were placed in this encounter.   Follow-up: Return for prn.  Jolayne Natter CNM 08/21/2023 2:15 PM

## 2023-08-27 LAB — CYTOLOGY - PAP
Chlamydia: NEGATIVE
Comment: NEGATIVE
Comment: NEGATIVE
Comment: NEGATIVE
Comment: NEGATIVE
Comment: NEGATIVE
Comment: NORMAL
HPV 16: NEGATIVE
HPV 18 / 45: NEGATIVE
High risk HPV: POSITIVE — AB
Neisseria Gonorrhea: NEGATIVE
Trichomonas: NEGATIVE

## 2023-08-28 ENCOUNTER — Ambulatory Visit: Payer: Self-pay | Admitting: Advanced Practice Midwife

## 2023-08-29 ENCOUNTER — Other Ambulatory Visit: Payer: Self-pay

## 2023-08-29 ENCOUNTER — Emergency Department (HOSPITAL_COMMUNITY)
Admission: EM | Admit: 2023-08-29 | Discharge: 2023-08-29 | Disposition: A | Discharge status: Home / Self Care | Attending: Emergency Medicine | Admitting: Emergency Medicine

## 2023-08-29 DIAGNOSIS — B9689 Other specified bacterial agents as the cause of diseases classified elsewhere: Secondary | ICD-10-CM | POA: Insufficient documentation

## 2023-08-29 DIAGNOSIS — N898 Other specified noninflammatory disorders of vagina: Secondary | ICD-10-CM | POA: Diagnosis present

## 2023-08-29 DIAGNOSIS — N760 Acute vaginitis: Secondary | ICD-10-CM | POA: Diagnosis not present

## 2023-08-29 LAB — URINALYSIS, ROUTINE W REFLEX MICROSCOPIC
Bacteria, UA: NONE SEEN
Bilirubin Urine: NEGATIVE
Glucose, UA: NEGATIVE mg/dL
Hgb urine dipstick: NEGATIVE
Ketones, ur: NEGATIVE mg/dL
Nitrite: NEGATIVE
Protein, ur: NEGATIVE mg/dL
Specific Gravity, Urine: 1.004 — ABNORMAL LOW (ref 1.005–1.030)
pH: 6 (ref 5.0–8.0)

## 2023-08-29 LAB — WET PREP, GENITAL
Sperm: NONE SEEN
Trich, Wet Prep: NONE SEEN
WBC, Wet Prep HPF POC: >=10 — AB (ref <10)
Yeast Wet Prep HPF POC: NONE SEEN

## 2023-08-29 LAB — PREGNANCY, URINE: Preg Test, Ur: NEGATIVE

## 2023-08-29 MED ORDER — METRONIDAZOLE 500 MG PO TABS: 500.0000 mg | ORAL_TABLET | Freq: Two times a day (BID) | ORAL | 0 refills | Status: DC

## 2023-08-29 MED ORDER — METRONIDAZOLE 0.75 % EX GEL
1.0000 | Freq: Two times a day (BID) | CUTANEOUS | 0 refills | Status: DC
Start: 2023-08-29 — End: 2024-02-03

## 2023-08-29 NOTE — ED Notes (Signed)
 Up to b/r for self swab and urine sample

## 2023-08-29 NOTE — ED Triage Notes (Signed)
 Pt reports mid and left lower pelvic pain that started 4-5 days ago. Sts pain associated with foul yellow-gray vaginal discharge, burning with urination, and urinary frequency.

## 2023-08-29 NOTE — Discharge Instructions (Addendum)
 Your workup showed evidence of bacterial vaginosis.  Flagyl  sent into the pharmacy.  Return for any emergent symptoms.

## 2023-08-29 NOTE — ED Notes (Signed)
 Delay in d/c d/t influx of pts, and multiple d/c's.

## 2023-08-29 NOTE — ED Notes (Signed)
 Pelvic cart to bedside, set up for exam.

## 2023-08-29 NOTE — ED Provider Notes (Signed)
 [place] EMERGENCY DEPARTMENT AT [facility] Provider Note   CSN: [id number] Arrival date & time: 08/29/23  4098     Patient presents with: Pelvic Pain   Kayla Nicholson is a 33 y.o. female.   33 year old female presents today for concern of vaginal discharge.  She states she has a mild dull pelvic pressure.  She is concerned she has bacterial vaginosis.  She states the pressure as well as the discharge is typical for BV.  She denies any sharp or significant pain.  She denies any fever, dysuria.  Recently had a pelvic exam done by her GYN about 1 week ago.  Her Pap smear showed some abnormal results otherwise her exam was normal.  She states she also recently had unprotected sex with her long-term partner.  She was advised to use a condom unless she was actively trying to get pregnant which is why she believes she might have BV.  The history is provided by the patient. No language interpreter was used.       Prior to Admission medications   Medication Sig Start Date End Date Taking? Authorizing Provider  AZO-CRANBERRY PO Take by mouth.    [provider]  Probiotic Product (PROBIOTIC PO) Take by mouth.    [provider]    Allergies: Bee venom, Macrobid  [nitrofurantoin  monohyd macro], and Nickel    Review of Systems  Constitutional:  Negative for fever.  Genitourinary:  Positive for pelvic pain (Mild pressure.) and vaginal discharge. Negative for dysuria, flank pain and vaginal bleeding.  All other systems reviewed and are negative.   Updated Vital Signs BP 119/85 (BP Location: Right Arm)   Pulse 79   Temp 98.4 F (36.9 C) (Oral)   Resp 19   Ht 5' 6 (1.676 m)   Wt 68 kg   LMP 08/14/2023 (Exact Date)   SpO2 99%   BMI 24.21 kg/m   Physical Exam Vitals and nursing note reviewed.  Constitutional:      General: She is not in acute distress.    Appearance: Normal appearance. She is not ill-appearing.  HENT:     Head: Normocephalic and  atraumatic.     Nose: Nose normal.   Eyes:     Conjunctiva/sclera: Conjunctivae normal.   Pulmonary:     Effort: Pulmonary effort is normal. No respiratory distress.  Abdominal:     General: There is no distension.     Palpations: Abdomen is soft.     Tenderness: There is no abdominal tenderness. There is no right CVA tenderness, left CVA tenderness, guarding or rebound.  Genitourinary:    Comments: Patient defers GU exam  Musculoskeletal:        General: No deformity.   Skin:    Findings: No rash.   Neurological:     Mental Status: She is alert.     (all labs ordered are listed, but only abnormal results are displayed) Labs Reviewed  URINALYSIS, ROUTINE W REFLEX MICROSCOPIC - Abnormal; Notable for the following components:      Result Value   Color, Urine STRAW (*)    Specific Gravity, Urine 1.004 (*)    Leukocytes,Ua MODERATE (*)    All other components within normal limits  WET PREP, GENITAL  PREGNANCY, URINE  GC/CHLAMYDIA PROBE AMP ([place]) NOT AT [facility]    EKG: None  Radiology: No results found.   Procedures   Medications Ordered in the ED - No data to display  Medical Decision Making Amount and/or Complexity of Data Reviewed Labs: ordered.   Medical Decision Making / ED Course   This patient presents to the ED for concern of vaginal discharge, this involves an extensive number of treatment options, and is a complaint that carries with it a high risk of complications and morbidity.  The differential diagnosis includes BV, STI, UTI, pyelonephritis, ovarian torsion.  Ovarian torsion less likely given history and physical exam.  No recent change in sexual partners.  No suspicion for PID.  Recent normal pelvic exam  MDM: 33 year old female presents today for concern of vaginal discharge.  She is concerned for BV.  She states the symptoms including the pressure is consistent with her BV infection in the past. Patient  prefers not to have a pelvic exam done and would like to self swab. She is okay with sending a STI panel.  UA without evidence of UTI.  Wet prep with evidence of BV. Flagyl  sent into the pharmacy. Discharged in stable condition.  Return precautions discussed.  Patient voices understanding and is in agreement with plan. Patient prefers topical Flagyl .  This was sent in.   Additional history obtained: -Additional history obtained from chart review -External records from outside source obtained and reviewed including: Chart review including previous notes, labs, imaging, consultation notes   Lab Tests: -I ordered, reviewed, and interpreted labs.   The pertinent results include:   Labs Reviewed  WET PREP, GENITAL - Abnormal; Notable for the following components:      Result Value   Clue Cells Wet Prep HPF POC PRESENT (*)    WBC, Wet Prep HPF POC >=10 (*)    All other components within normal limits  URINALYSIS, ROUTINE W REFLEX MICROSCOPIC - Abnormal; Notable for the following components:   Color, Urine STRAW (*)    Specific Gravity, Urine 1.004 (*)    Leukocytes,Ua MODERATE (*)    All other components within normal limits  PREGNANCY, URINE  GC/CHLAMYDIA PROBE AMP ([place]) NOT AT [facility]      EKG  EKG Interpretation Date/Time:    Ventricular Rate:    PR Interval:    QRS Duration:    QT Interval:    QTC Calculation:   R Axis:      Text Interpretation:            Medicines ordered and prescription drug management: No orders of the defined types were placed in this encounter.   -I have reviewed the patients home medicines and have made adjustments as needed     Reevaluation: After the interventions noted above, I reevaluated the patient and found that they have :stayed the same  Co morbidities that complicate the patient evaluation  Past Medical History:  Diagnosis Date   Abnormal Papanicolaou smear of cervix with positive human papilloma virus (HPV) test  04/17/2018   ASCUS with +HPV, will get colpo    Back pain    BV (bacterial vaginosis) 12/22/2013   Chlamydia 04/17/2018   Contraceptive management 02/18/2014   Irregular bleeding 07/27/2014   LLQ discomfort 07/27/2014   Trichimoniasis    Urinary frequency 07/27/2014   Yeast infection 04/27/2014      Dispostion: Discharged in stable condition.  Return precaution discussed.  Patient voices understanding and is in agreement with plan.    Final diagnoses:  BV (bacterial vaginosis)    ED Discharge Orders          Ordered    metroNIDAZOLE  (FLAGYL ) 500 MG tablet  2  times daily,   Status:  Discontinued        08/29/23 0821    metroNIDAZOLE  (METROGEL ) 0.75 % gel  2 times daily        08/29/23 0823               Lucina Sabal, PA-C 08/29/23 0824    Albertus Hughs, DO 08/29/23 [phone]

## 2023-08-30 LAB — GC/CHLAMYDIA PROBE AMP (~~LOC~~) NOT AT ARMC
Chlamydia: NEGATIVE
Comment: NEGATIVE
Comment: NORMAL
Neisseria Gonorrhea: NEGATIVE

## 2023-09-02 ENCOUNTER — Ambulatory Visit

## 2023-09-10 ENCOUNTER — Encounter: Admitting: Women's Health

## 2023-09-16 ENCOUNTER — Encounter: Admitting: Women's Health

## 2023-09-26 ENCOUNTER — Ambulatory Visit: Admitting: Adult Health

## 2023-09-30 ENCOUNTER — Ambulatory Visit: Admitting: Adult Health

## 2023-10-29 ENCOUNTER — Encounter: Payer: Self-pay | Admitting: Women's Health

## 2023-10-29 ENCOUNTER — Ambulatory Visit: Admitting: Women's Health

## 2023-10-29 ENCOUNTER — Other Ambulatory Visit (HOSPITAL_COMMUNITY)
Admission: RE | Admit: 2023-10-29 | Discharge: 2023-10-29 | Disposition: A | Discharge status: Home / Self Care | Source: Ambulatory Visit | Attending: Women's Health | Admitting: Women's Health

## 2023-10-29 VITALS — BP 114/76 | HR 106 | Ht 66.0 in | Wt 144.0 lb

## 2023-10-29 DIAGNOSIS — R87612 Low grade squamous intraepithelial lesion on cytologic smear of cervix (LGSIL): Secondary | ICD-10-CM | POA: Insufficient documentation

## 2023-10-29 DIAGNOSIS — Z3202 Encounter for pregnancy test, result negative: Secondary | ICD-10-CM

## 2023-10-29 NOTE — Patient Instructions (Signed)
 Colposcopy, Care After  The following information offers guidance on how to care for yourself after your procedure. Your health care provider may also give you more specific instructions. If you have problems or questions, contact your health care provider. What can I expect after the procedure? If you had a colposcopy without a biopsy, you can expect to feel fine right away after your procedure. However, you may have some spotting of blood for a few days. You can return to your normal activities. If you had a colposcopy with a biopsy, it is common after the procedure to have: Soreness and mild pain. These may last for a few days. Mild vaginal bleeding or discharge that is dark-colored and grainy. This may last for a few days. The discharge may be caused by a liquid (solution) that was used during the procedure. You may need to wear a sanitary pad during this time. Spotting of blood for at least 48 hours after the procedure. Follow these instructions at home: Medicines Take over-the-counter and prescription medicines only as told by your health care provider. Talk with your health care provider about what type of over-the-counter pain medicines and prescription medicines you can start to take again. It is especially important to talk with your health care provider if you take blood thinners. Activity Avoid using douche products, using tampons, and having sex for at least 3 days after the procedure or for as long as told by your health care provider. Return to your normal activities as told by your health care provider. Ask your health care provider what activities are safe for you. General instructions Ask your health care provider if you may take baths, swim, or use a hot tub. You may take showers. If you use birth control (contraception), continue to use it. Keep all follow-up visits. This is important. Contact a health care provider if: You have a fever or chills. You faint or feel  light-headed. Get help right away if: You have heavy bleeding from your vagina or pass blood clots. Heavy bleeding is bleeding that soaks through a sanitary pad in less than 1 hour. You have vaginal discharge that is abnormal, is yellow in color, or smells bad. This could be a sign of infection. You have severe pain or cramps in your lower abdomen that do not go away with medicine. Summary If you had a colposcopy without a biopsy, you can expect to feel fine right away, but you may have some spotting of blood for a few days. You can return to your normal activities. If you had a colposcopy with a biopsy, it is common to have mild pain for a few days and spotting for 48 hours after the procedure. Avoid using douche products, using tampons, and having sex for at least 3 days after the procedure or for as long as told by your health care provider. Get help right away if you have heavy bleeding, severe pain, or signs of infection. This information is not intended to replace advice given to you by your health care provider. Make sure you discuss any questions you have with your health care provider. Document Revised: 07/31/2020 Document Reviewed: 07/31/2020 Elsevier Patient Education  2024 [employer].

## 2023-10-29 NOTE — Progress Notes (Signed)
   COLPOSCOPY PROCEDURE NOTE Patient name: Kayla Nicholson MRN [id number]  Date of birth: 07-21-1990 Subjective Findings:   Kayla KARI MONTERO is a 33 y.o. G64P0101 African American female being seen today for a colposcopy. Indication: Abnormal pap on 08/21/23: LSIL w/ HRHPV positive: other (not 16, 18/45)  Prior cytology:  Date Result Procedure  10/23/22 LSIL w/ +HRHPV (not typed) Left before getting colpo done  04/20/21 NILM w/ HRHPV positive: other (not 16, 18/45) None  2021 NILM w/ HRHPV negative None  2020 ASCUS w/ HRHPV positive: type not specified Colposcopy: neg bx  2017 NILM w/ HRHPV not done None  Patient's last menstrual period was 10/10/2023. Contraception: none. Menopausal: no. Hysterectomy: no.   Considering pregnancy: Yes New sex partner: yes Smoker: THC, cigars. Immunocompromised: no.   The risks and benefits were explained and informed consent was obtained, and written copy is in chart. Pertinent History Reviewed:   Reviewed past medical,surgical, social, obstetrical and family history.  Reviewed problem list, medications and allergies. Objective Findings & Procedure:   Vitals:   10/29/23 1429  BP: 114/76  Pulse: (!) 106  Weight: 144 lb (65.3 kg)  Height: 5' 6 (1.676 m)  Body mass index is 23.24 kg/m.  No results found for this or any previous visit (from the past 24 hours).   Time out was performed.  Speculum placed in the vagina, cervix fully visualized. SCJ: fully visualized. Cervix swabbed x 3 with acetic acid.  Acetowhitening present: Yes Cervix: no mosaicism, no punctation, no abnormal vasculature, and acetowhite lesion(s) noted at 3-9 o'clock. Cervical biopsies taken at 6 & 9 o'clock and Hemostasis achieved with Monsel's solution. Vagina: vaginal colposcopy not performed Vulva: vulvar colposcopy not performed  Specimens: 2  Complications: none  Chaperone: Alan Fischer  Colposcopic Impression & Plan:   Colposcopy findings consistent with LSIL Plan: Post  biopsy instructions given, Will notify patient of results when back, and Will base plan of care on pathology results and ASCCP guidelines  Return in about 1 year (around 10/28/2024) for Pap & physical.  Suzen JONELLE Fetters CNM, [facility] 10/29/2023 3:11 PM

## 2023-10-29 NOTE — Addendum Note (Signed)
 Addended by: BERNARDO ALAN CROME on: 10/29/2023 03:15 PM   Modules accepted: Orders

## 2023-10-31 ENCOUNTER — Ambulatory Visit: Payer: Self-pay | Admitting: Women's Health

## 2023-10-31 DIAGNOSIS — Z8742 Personal history of other diseases of the female genital tract: Secondary | ICD-10-CM

## 2023-10-31 LAB — SURGICAL PATHOLOGY

## 2024-01-07 ENCOUNTER — Encounter: Admitting: Nurse Practitioner

## 2024-01-27 ENCOUNTER — Ambulatory Visit

## 2024-01-27 ENCOUNTER — Ambulatory Visit: Admitting: Adult Health

## 2024-02-03 ENCOUNTER — Other Ambulatory Visit (HOSPITAL_COMMUNITY)
Admission: RE | Admit: 2024-02-03 | Discharge: 2024-02-03 | Disposition: A | Discharge status: Home / Self Care | Source: Ambulatory Visit | Attending: Obstetrics & Gynecology | Admitting: Obstetrics & Gynecology

## 2024-02-03 ENCOUNTER — Ambulatory Visit: Admitting: *Deleted

## 2024-02-03 DIAGNOSIS — R1030 Lower abdominal pain, unspecified: Secondary | ICD-10-CM

## 2024-02-03 DIAGNOSIS — N898 Other specified noninflammatory disorders of vagina: Secondary | ICD-10-CM | POA: Diagnosis present

## 2024-02-03 NOTE — Progress Notes (Signed)
   NURSE VISIT- VAGINITIS/STD  SUBJECTIVE:  Kayla Nicholson is a 33 y.o. G1P0101 GYN patientfemale here for a vaginal swab for vaginitis screening, STD screen.  She reports the following symptoms: discharge described as frothy white, odor, and vulvar itching for 3 weeks. Symptoms developed after intercourse.  Denies abnormal vaginal bleeding, significant pelvic pain, fever, or UTI symptoms.  OBJECTIVE:  There were no vitals taken for this visit.  Appears well, in no apparent distress  ASSESSMENT: Vaginal swab for chlamydia, gonorrhea, BV, Yeast, trich PLAN: Self-collected vaginal probe for Gonorrhea, Chlamydia, Trichomonas, Bacterial Vaginosis, Yeast sent to lab Treatment: to be determined once results are received Follow-up as needed if symptoms persist/worsen, or new symptoms develop  Benno Brensinger  02/03/2024 1:51 PM

## 2024-02-04 ENCOUNTER — Encounter: Payer: Self-pay | Admitting: Family Medicine

## 2024-02-04 LAB — CERVICOVAGINAL ANCILLARY ONLY
Bacterial Vaginitis (gardnerella): POSITIVE — AB
Candida Glabrata: NEGATIVE
Candida Vaginitis: NEGATIVE
Chlamydia: NEGATIVE
Comment: NEGATIVE
Comment: NEGATIVE
Comment: NEGATIVE
Comment: NEGATIVE
Comment: NEGATIVE
Comment: NORMAL
Neisseria Gonorrhea: NEGATIVE
Trichomonas: NEGATIVE

## 2024-02-05 ENCOUNTER — Other Ambulatory Visit: Payer: Self-pay | Admitting: Adult Health

## 2024-02-05 ENCOUNTER — Ambulatory Visit: Payer: Self-pay | Admitting: Adult Health

## 2024-02-05 ENCOUNTER — Encounter: Payer: Self-pay | Admitting: *Deleted

## 2024-02-05 MED ORDER — METRONIDAZOLE 0.75 % VA GEL: 1.0000 | Freq: Every day | VAGINAL | 0 refills | Status: AC

## 2024-02-05 NOTE — Progress Notes (Signed)
 She says she is allergic to flagyl , will rx metrogel 

## 2024-02-05 NOTE — Progress Notes (Signed)
 Rx in flagyl 

## 2024-07-30 ENCOUNTER — Ambulatory Visit: Admitting: Family Medicine
